# Patient Record
Sex: Male | Born: 1990 | Race: White | Hispanic: Yes | Marital: Single | Smoking: Former smoker
Health system: Southern US, Community
[De-identification: ages and names within clinical notes are randomized; demographics above are authoritative.]

## PROBLEM LIST (undated history)

## (undated) DIAGNOSIS — F329 Major depressive disorder, single episode, unspecified: Secondary | ICD-10-CM

## (undated) DIAGNOSIS — F419 Anxiety disorder, unspecified: Secondary | ICD-10-CM

## (undated) DIAGNOSIS — G4700 Insomnia, unspecified: Secondary | ICD-10-CM

## (undated) DIAGNOSIS — F32A Depression, unspecified: Secondary | ICD-10-CM

## (undated) DIAGNOSIS — E669 Obesity, unspecified: Secondary | ICD-10-CM

## (undated) HISTORY — DX: Obesity, unspecified: E66.9

## (undated) HISTORY — DX: Depression, unspecified: F32.A

## (undated) HISTORY — DX: Anxiety disorder, unspecified: F41.9

## (undated) HISTORY — DX: Major depressive disorder, single episode, unspecified: F32.9

## (undated) HISTORY — DX: Insomnia, unspecified: G47.00

---

## 2011-05-13 ENCOUNTER — Ambulatory Visit (INDEPENDENT_AMBULATORY_CARE_PROVIDER_SITE_OTHER): Payer: BC Managed Care – PPO | Admitting: Physician Assistant

## 2011-05-13 ENCOUNTER — Encounter: Payer: Self-pay | Admitting: Physician Assistant

## 2011-05-13 DIAGNOSIS — Z0000 Encounter for general adult medical examination without abnormal findings: Secondary | ICD-10-CM

## 2011-05-13 DIAGNOSIS — F32A Depression, unspecified: Secondary | ICD-10-CM | POA: Insufficient documentation

## 2011-05-13 DIAGNOSIS — G4700 Insomnia, unspecified: Secondary | ICD-10-CM | POA: Insufficient documentation

## 2011-05-13 DIAGNOSIS — E669 Obesity, unspecified: Secondary | ICD-10-CM

## 2011-05-13 DIAGNOSIS — J029 Acute pharyngitis, unspecified: Secondary | ICD-10-CM

## 2011-05-13 DIAGNOSIS — F419 Anxiety disorder, unspecified: Secondary | ICD-10-CM | POA: Insufficient documentation

## 2011-05-13 DIAGNOSIS — K5900 Constipation, unspecified: Secondary | ICD-10-CM

## 2011-05-13 DIAGNOSIS — F329 Major depressive disorder, single episode, unspecified: Secondary | ICD-10-CM | POA: Insufficient documentation

## 2011-05-13 DIAGNOSIS — Z7251 High risk heterosexual behavior: Secondary | ICD-10-CM

## 2011-05-13 DIAGNOSIS — F341 Dysthymic disorder: Secondary | ICD-10-CM

## 2011-05-13 LAB — POCT URINALYSIS DIPSTICK
Bilirubin, UA: NEGATIVE
Blood, UA: NEGATIVE
Glucose, UA: NEGATIVE
Ketones, UA: NEGATIVE
Spec Grav, UA: >=1.03

## 2011-05-13 LAB — CBC
MCH: 26.2 pg (ref 26.0–34.0)
MCV: 79.8 fL (ref 78.0–100.0)
Platelets: 355 10*3/uL (ref 150–400)
RBC: 5.54 MIL/uL (ref 4.22–5.81)
RDW: 13.3 % (ref 11.5–15.5)
WBC: 7.6 10*3/uL (ref 4.0–10.5)

## 2011-05-13 LAB — COMPREHENSIVE METABOLIC PANEL
ALT: 30 U/L (ref 0–53)
AST: 23 U/L (ref 0–37)
CO2: 27 mEq/L (ref 19–32)
Chloride: 104 mEq/L (ref 96–112)
Creat: 0.83 mg/dL (ref 0.50–1.35)
Sodium: 140 mEq/L (ref 135–145)
Total Bilirubin: 0.5 mg/dL (ref 0.3–1.2)
Total Protein: 7.5 g/dL (ref 6.0–8.3)

## 2011-05-13 LAB — POCT UA - MICROSCOPIC ONLY
Crystals, Ur, HPF, POC: NEGATIVE
Mucus, UA: NEGATIVE
RBC, urine, microscopic: NEGATIVE

## 2011-05-13 LAB — LIPID PANEL
Cholesterol: 217 mg/dL — ABNORMAL HIGH (ref 0–200)
HDL: 46 mg/dL (ref >39)
Triglycerides: 66 mg/dL (ref <150)

## 2011-05-13 LAB — TSH: TSH: 1.558 u[IU]/mL (ref 0.350–4.500)

## 2011-05-13 MED ORDER — FLUOXETINE HCL 20 MG PO TABS: 20.0000 mg | ORAL_TABLET | Freq: Every day | ORAL | Status: DC

## 2011-05-13 MED ORDER — ALBUTEROL SULFATE HFA 108 (90 BASE) MCG/ACT IN AERS: 2.0000 | INHALATION_SPRAY | RESPIRATORY_TRACT | Status: DC | PRN

## 2011-05-13 NOTE — Progress Notes (Signed)
Patient ID: Jon Krause MRN: [id number], DOB: 1990/08/19 21 y.o. Date of Encounter: 05/13/2011, 7:29 PM  Primary Physician: No primary provider on file.  Chief Complaint: Physical (CPE)  HPI: 21 y.o. y/o male with history noted below here for CPE. He has several issues that he would like to discuss today.  1) Anxiety and depression. He has been seeing Madelaine Etienne for over 1 year now for this issue. He is doing much better currently after seeing her on a regular basis. He has not taken his Prozac for several months now. When he was taking it he states it did really help him with this anxiety and depression. He feels like his anxiety and depression have played a role in his grades declining in school. He also feel like he cannot focus while in class and studying and requests ADHD evaluation. (We will perform this at a later date.) He has not had any further thoughts or attempts of SI since prior to his last OV her in June 2012. Never with HI. He states if he feels depressed he has a great friend at school that he talks with. His SSP does play a role in his anxiety/depression. Even though he has many friends he does not feel fully accepted in college, thus he feels the need to have multiple SSP and risky sexual behaviors. He gets STD checks every three months at outside clinic, and has always tested negative. He did have one encounter with a new male partner during the winter in which he did not use a condom. Since that exposure he has had a negative STD check. He is currently waiting for his most recent STD check results that he had on 05/10/11. His father is not supportive of his lifestyle further causing stress in his life. He is slowly coming to terms with this and is beginning to feel ok about his father.    2) Insomnia. He has a difficult time turning his mind off at night. Has filled the Klonopin from the June OV, but not yet taken any. He is open to trying this medication.   3) Constipation.  Will get very constipated frequently. Does not drink much water and will goes several days without a BM. Many times when he does have a BM he is required to strain a considerable amount, and will pass painful large caliber stools. He will then notice a small amount of bright red blood on the toilet tissue. Never any blood with stool or in the toilet bowl.   4) Weight gain. He states he has recently began eating a little healthier and exercising 30 minutes several times per week. He notes when he attempts to exercise he gets winded easily and develops some wheezing. No formal diagnosis of EIA has ever been made. Even with his attempts at eating healthier and exercise he feels like he continues to gain weight. He currently drinks 2-3 regular sodas per day along with juices and sweet tea. Most food choices are fried foods. Eats one large meal per day.   5) Sore throat. Has had a mild sore throat for several days. No other URI symptoms. Afebrile. Has not tried any medications.   Eye MD: 10/2010, normal DDS: 03/2011, normal See scanned in blue sheet.  Review of Systems: Consitutional: No fever, chills, fatigue, night sweats, lymphadenopathy, or weight changes. Eyes: No visual changes, eye redness, or discharge. ENT/Mouth: See above. Ears: No otalgia, tinnitus, hearing loss, discharge. Nose: No congestion, rhinorrhea, sinus pain, or epistaxis.  Throat: No post nasal drip or teeth pain. Cardiovascular: No CP, palpitations, diaphoresis, DOE, edema, orthopnea, PND. Respiratory: No cough, hemoptysis, SOB, or wheezing. Gastrointestinal: See above. No anorexia, dysphagia, reflux, pain, nausea, vomiting, hematemesis, diarrhea, or melena. Genitourinary: No dysuria, frequency, urgency, hematuria, incontinence, nocturia, decreased urinary stream, discharge, impotence, or testicular pain/masses. Musculoskeletal: No decreased ROM, myalgias, stiffness, joint swelling, or weakness. Skin: No rash, erythema, lesion  changes, pain, warmth, jaundice, or pruritis. Neurological: No headache, dizziness, syncope, seizures, tremors, memory loss, coordination problems, or paresthesias. Psychological: No hallucinations or SI/HI. Endocrine: No fatigue, polydipsia, polyphagia, polyuria, or known diabetes. All other systems were reviewed and are otherwise negative.  Past Medical History  Diagnosis Date  . Anxiety   . Depression   . Insomnia   . Obesity      History reviewed. No pertinent past surgical history.  Home Meds:  Prior to Admission medications   Not on File    Allergies: No Known Allergies  History   Social History  . Marital Status: Single    Spouse Name: N/A    Number of Children: N/A  . Years of Education: N/A   Occupational History  . Not on file.   Social History Main Topics  . Smoking status: Current Some Day Smoker -- 0.2 packs/day for .5 years    Types: Cigarettes  . Smokeless tobacco: Never Used  . Alcohol Use: 1.8 oz/week    3 Cans of beer per week  . Drug Use: No  . Sexually Active: Yes -- Male partner(s)     MSM   Other Topics Concern  . Not on file   Social History Narrative  . No narrative on file    Family History  Problem Relation Age of Onset  . Seizures Father   . Cancer Paternal Aunt     colon    Physical Exam: Blood pressure 145/86, pulse 81, temperature 99 F (37.2 C), temperature source Oral, resp. rate 16, height 5' 9.75" (1.772 m), weight 256 lb (116.121 kg).  General: Well developed, well nourished, in no acute distress. HEENT: Normocephalic, atraumatic. Conjunctiva pink, sclera non-icteric. Pupils 2 mm constricting to 1 mm, round, regular, and equally reactive to light and accomodation. EOMI. Internal auditory canal clear. TMs with good cone of light and without pathology. Nasal mucosa pink. Nares are without discharge. No sinus tenderness. Oral mucosa pink. Dentition normal. Pharynx without exudate.   Neck: Supple. Trachea midline. No  thyromegaly. Full ROM. No lymphadenopathy. Lungs: Clear to auscultation bilaterally without wheezes, rales, or rhonchi. Breathing is of normal effort and unlabored. Cardiovascular: RRR with S1 S2. No murmurs, rubs, or gallops appreciated. Distal pulses 2+ symmetrically. No carotid or abdominal bruits. Abdomen: Soft, non-tender, non-distended with normoactive bowel sounds. No hepatosplenomegaly or masses. No rebound/guarding. No CVA tenderness. Without hernias.  Rectal: Small external hemorrhoid without thrombosis. No fissures. Rectal vault without masses. Small symmetrical prostate. Genitourinary: Circumcised male. No penile lesions. Testes descended bilaterally, and smooth without tenderness or masses.  Musculoskeletal: Full range of motion and 5/5 strength throughout. Without swelling, atrophy, tenderness, crepitus, or warmth. Extremities without clubbing, cyanosis, or edema. Calves supple. Skin: Warm and moist without erythema, ecchymosis, wounds, or rash. Neuro: A+Ox3. CN II-XII grossly intact. Moves all extremities spontaneously. Full sensation throughout. Normal gait. DTR 2+ throughout upper and lower extremities. Finger to nose intact. Psych:  Responds to questions appropriately with a normal affect.   Studies: CBC, CMET, Lipid, TSH, TC, GC/Chlamydia culture all pending. Patient is fasting. Results for  orders placed in visit on 05/13/11  POCT RAPID STREP A (OFFICE)      Component Value Range   Rapid Strep A Screen Negative  Negative   POCT URINALYSIS DIPSTICK      Component Value Range   Color, UA yellow     Clarity, UA clear     Glucose, UA neg     Bilirubin, UA neg     Ketones, UA neg     Spec Grav, UA >=1.030     Blood, UA neg     pH, UA 5.5     Protein, UA neg     Urobilinogen, UA 0.2     Nitrite, UA neg     Leukocytes, UA Negative    POCT UA - MICROSCOPIC ONLY      Component Value Range   WBC, Ur, HPF, POC 0-2     RBC, urine, microscopic neg     Bacteria, U Microscopic  trace     Mucus, UA neg     Epithelial cells, urine per micros 0-1     Crystals, Ur, HPF, POC neg     Casts, Ur, LPF, POC neg     Yeast, UA neg    IFOBT (OCCULT BLOOD)      Component Value Range   IFOBT Negative       Assessment/Plan:  21 y.o. y/o Hispanic male here for CPE with anxiety/depression, insomnia, high risk sexual behavior, constipation/BRBPR, weight gain, EIA,  pharyngitis, and possible ADHD. 1. Anxiety/Depression -Restart Prozac 20 mg #30 1 po daily RF 1 -Continue counseling -RTC/ER precautions  2. Insomnia -Likely partially related to the above -Take the Klonopin that was prescribed in June. Start with 1/2 po qhs. May titrate up to 1 qhs. -Good sleep hygiene  3. High risk sexual behavior -Advised safe sex practices -Await cultures -Patient to forward lab results of most recent STD screen once he gets them  4. Constipation/BRBPR -BRBPR likely due to constipation/hemorrhoids/straining -Miralax daily -Increase water -If hemorrhoids/bleeding RTC  5. Weight gain -Daily exercise -Healthy diet, decrease sodas and fried foods -Await TSH  6. EIA -Trial of Proventil #1 2 puffs inhaled q 4-6 hours prn RF 2 -Suspect there is a component of deconditioning in play as well  7. Pharyngitis -Negative RST -Await TC, GC culture, and Chlamydia culture -If persists RTC  8. Possible ADHD -Will address this at follow up visit as time allows -Would like to see how restarting his Prozac and Klonopin trial go, prior to initiating another medication at this time  9. Recheck pending labs. If labs are normal, patient advised to recheck in 1 month  Signed, Eula Listen, PA-C 05/13/2011 7:29 PM

## 2011-05-15 NOTE — Progress Notes (Signed)
Quick Note:  Await final results.  Eula Listen, PA-C 05/15/2011 7:34 AM ______

## 2011-05-16 LAB — GONOCOCCUS CULTURE: Organism ID, Bacteria: NO GROWTH

## 2011-05-16 LAB — CHLAMYDIA, SMEAR (DFA)

## 2011-06-17 ENCOUNTER — Ambulatory Visit: Payer: BC Managed Care – PPO | Admitting: Physician Assistant

## 2011-06-24 ENCOUNTER — Ambulatory Visit (INDEPENDENT_AMBULATORY_CARE_PROVIDER_SITE_OTHER): Payer: BC Managed Care – PPO | Admitting: Physician Assistant

## 2011-06-24 ENCOUNTER — Encounter: Payer: Self-pay | Admitting: Physician Assistant

## 2011-06-24 VITALS — BP 153/86 | HR 76 | Temp 98.5°F | Resp 16 | Ht 69.5 in | Wt 258.0 lb

## 2011-06-24 DIAGNOSIS — F909 Attention-deficit hyperactivity disorder, unspecified type: Secondary | ICD-10-CM

## 2011-06-24 DIAGNOSIS — F329 Major depressive disorder, single episode, unspecified: Secondary | ICD-10-CM

## 2011-06-24 DIAGNOSIS — F339 Major depressive disorder, recurrent, unspecified: Secondary | ICD-10-CM

## 2011-06-24 DIAGNOSIS — J301 Allergic rhinitis due to pollen: Secondary | ICD-10-CM

## 2011-06-24 MED ORDER — FLUTICASONE PROPIONATE 50 MCG/ACT NA SUSP: 2.0000 | Freq: Every day | NASAL | Status: DC

## 2011-06-24 MED ORDER — AMPHETAMINE-DEXTROAMPHETAMINE 5 MG PO TABS: 5.0000 mg | ORAL_TABLET | Freq: Two times a day (BID) | ORAL | Status: DC

## 2011-06-24 NOTE — Progress Notes (Signed)
Patient ID: Jon Krause MRN: [id number], DOB: 10-25-1990, 21 y.o. Date of Encounter: 06/24/2011, 4:21 PM  Primary Physician: No primary provider on file.  Chief Complaint: Follow up depression and to discuss ADHD  HPI: 21 y.o. year old male with history below presents for follow up of depression and to discuss difficulties with concentrating and poor grades. 1. Depression: doing quite well on Prozac 20 mg daily. He feels good about where he is life life right now except for his grades. He is enjoying spending time with his friends who are his family here in [place] as his parents are in [place]. He does speak with his family in [place] regularly and his mother continues to be supportive of him. No SI or HI. Continues to enjoy his hobbies. He has not yet taken the Klonopin to help with his insomnia. He feels like his insomnia is stemming from lack of focus during the day causing him to do his homework late at night. See below for details.  2. Difficulty focusing: He is having considerable difficulty focusing in class and at home. He is unable to sit down at a desk and complete his homework without interrupting himself many times to clean or do other household chores. He will even stop doing homework with friends to clean if he gets distracted. His grades have suffered considerably due to this. He is now on academic probation. He feels that almost all of his depression now comes from his poor academic performance. He regularly sees school counselors or his therapist to discuss his performance. He would like help focusing on his homework to be able to get it done in a timely manor that would allow for him to sleep at a good hour and perhaps improve his insomnia. Please see scanned in self ADHD evaluation.   3. Lastly, he mentions a flare up of his allergies. 1-2 weeks prior he began with increased sneezing, watery eyes, and rhinorrhea. He has suffered from allergies since moving to [place] from [place]. No wheezing or SOB. He  has not tried any OTC medications to help. He requests help.   Past Medical History  Diagnosis Date  . Anxiety   . Depression   . Insomnia   . Obesity      Home Meds: Prior to Admission medications   Medication Sig Start Date End Date Taking? Authorizing Provider  albuterol (PROVENTIL HFA;VENTOLIN HFA) 108 (90 BASE) MCG/ACT inhaler Inhale 2 puffs into the lungs every 4 (four) hours as needed for wheezing. 05/13/11 05/12/12 Yes Connee Ikner M Achsah Mcquade, PA-C  FLUoxetine (PROZAC) 20 MG tablet Take 1 tablet (20 mg total) by mouth daily. 05/13/11 08/11/11 Yes Jeidy Hoerner M Len Azeez, PA-C                  Allergies: No Known Allergies  History   Social History  . Marital Status: Single    Spouse Name: N/A    Number of Children: N/A  . Years of Education: N/A   Occupational History  . Not on file.   Social History Main Topics  . Smoking status: Current Some Day Smoker -- 0.2 packs/day for .5 years    Types: Cigarettes  . Smokeless tobacco: Never Used  . Alcohol Use: 1.8 oz/week    3 Cans of beer per week  . Drug Use: No  . Sexually Active: Yes -- Male partner(s)     MSM   Other Topics Concern  . Not on file   Social History Narrative  .  No narrative on file     Review of Systems: Constitutional: negative for chills, fever, night sweats, weight changes, or fatigue  HEENT: negative for vision changes, hearing loss, congestion, ST, epistaxis, or sinus pressure Cardiovascular: negative for chest pain or palpitations Respiratory: negative for hemoptysis, wheezing, shortness of breath, or cough Abdominal: negative for abdominal pain, nausea, vomiting, diarrhea, or constipation Dermatological: negative for rash Neurologic: negative for headache, dizziness, or syncope All other systems reviewed and are otherwise negative with the exception to those above and in the HPI.   Physical Exam: Blood pressure 153/86, pulse 76, temperature 98.5 F (36.9 C), temperature source Oral, resp. rate 16, height 5'  9.5" (1.765 m), weight 258 lb (117.028 kg)., Body mass index is 37.55 kg/(m^2). General: Well developed, well nourished, in no acute distress. Head: Normocephalic, atraumatic, eyes without discharge, sclera non-icteric, nares are without discharge. Bilateral auditory canals clear, TM's are without perforation, pearly grey and translucent with reflective cone of light bilaterally. Oral cavity moist, posterior pharynx without exudate, erythema, peritonsillar abscess, or post nasal drip.  Neck: Supple. No thyromegaly. Full ROM. No lymphadenopathy. Lungs: Clear bilaterally to auscultation without wheezes, rales, or rhonchi. Breathing is unlabored. Heart: RRR with S1 S2. No murmurs, rubs, or gallops appreciated. Msk:  Strength and tone normal for age. Extremities/Skin: Warm and dry. No clubbing or cyanosis. No edema. No rashes or suspicious lesions. Neuro: Alert and oriented X 3. Moves all extremities spontaneously. Gait is normal. CNII-XII grossly in tact. Psych:  Responds to questions appropriately with a normal affect.     ASSESSMENT AND PLAN:  21 y.o. year old male with depression, ADHD, and allergic rhinitis 1. Depression -Improving -Continue Prozac 20 mg 1 po daily, has 1 RF remaining, may call for more if runs out before next follow up in 1 month -Continue counseling -RTC/ER precautions -Good local support system  2. ADHD -Trial of Adderall 5 mg #60 1 po bid no RF SED -Recheck 1 month  3. Allergic rhinitis -OTC Zyrtec -Flonase 2 sprays each nare daily #1 RF 6 -May call for eye drops or inhaler if needed   Signed, Eula Listen, PA-C 06/24/2011 4:21 PM

## 2011-07-09 ENCOUNTER — Telehealth: Payer: Self-pay

## 2011-07-09 NOTE — Telephone Encounter (Signed)
PT STATES RYAN HAD GIVEN HIM A LOWER DOSAGE OF ADDERALL AND WAS TOLD TO CALL IT IF WASN'T WORKING AND IT ISNT' PLEASE CALL [phone]

## 2011-07-09 NOTE — Telephone Encounter (Signed)
Spoke with patient and let him know to take two tabs in am and two in pm.  Patient will call back in a week to let us know if this works.

## 2011-07-09 NOTE — Telephone Encounter (Signed)
LMOM to call back

## 2011-07-09 NOTE — Telephone Encounter (Signed)
Please advise him to go ahead and take 2 in the morning and 2 in the afternoon and call back with an update in a week or so. This will cause him to need a refill sooner, and that is ok to refill at that time.

## 2011-07-29 ENCOUNTER — Ambulatory Visit: Payer: BC Managed Care – PPO | Admitting: Physician Assistant

## 2011-07-29 ENCOUNTER — Telehealth: Payer: Self-pay

## 2011-07-29 NOTE — Telephone Encounter (Signed)
PT MISSED HIS 07/29/11 APPT (DID NOT GET A REMINDER CALL) HE HAS RESCHEDULED FOR 09/09/11. STATES RYAN HAD PRESCRIBED ADERROL 5MG  2X A DAY, THEN INCREASED TO 10MG  TWICE A DAY. 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

## 2011-07-30 MED ORDER — AMPHETAMINE-DEXTROAMPHETAMINE 10 MG PO TABS: 10.0000 mg | ORAL_TABLET | Freq: Two times a day (BID) | ORAL | Status: DC

## 2011-07-30 NOTE — Telephone Encounter (Signed)
Left message RX ready to be picked up and make sure comes in for follow up 09/09/11. Any questions call [phone]

## 2011-07-30 NOTE — Telephone Encounter (Signed)
Ok to refill Adderall. Done and printed.

## 2011-09-09 ENCOUNTER — Encounter: Payer: Self-pay | Admitting: Physician Assistant

## 2011-09-09 ENCOUNTER — Ambulatory Visit (INDEPENDENT_AMBULATORY_CARE_PROVIDER_SITE_OTHER): Payer: BC Managed Care – PPO | Admitting: Family Medicine

## 2011-09-09 ENCOUNTER — Ambulatory Visit: Payer: BC Managed Care – PPO

## 2011-09-09 VITALS — BP 122/74 | HR 69 | Temp 97.9°F | Resp 16 | Ht 69.25 in | Wt 254.0 lb

## 2011-09-09 DIAGNOSIS — F329 Major depressive disorder, single episode, unspecified: Secondary | ICD-10-CM

## 2011-09-09 DIAGNOSIS — M79643 Pain in unspecified hand: Secondary | ICD-10-CM

## 2011-09-09 DIAGNOSIS — F988 Other specified behavioral and emotional disorders with onset usually occurring in childhood and adolescence: Secondary | ICD-10-CM

## 2011-09-09 DIAGNOSIS — S60229A Contusion of unspecified hand, initial encounter: Secondary | ICD-10-CM

## 2011-09-09 DIAGNOSIS — Z23 Encounter for immunization: Secondary | ICD-10-CM

## 2011-09-09 DIAGNOSIS — M79609 Pain in unspecified limb: Secondary | ICD-10-CM

## 2011-09-09 MED ORDER — LISDEXAMFETAMINE DIMESYLATE 20 MG PO CAPS: 20.0000 mg | ORAL_CAPSULE | ORAL | Status: DC

## 2011-09-09 MED ORDER — NAPROXEN 500 MG PO TABS: 500.0000 mg | ORAL_TABLET | Freq: Two times a day (BID) | ORAL | Status: AC

## 2011-09-09 MED ORDER — FLUOXETINE HCL 40 MG PO CAPS: 40.0000 mg | ORAL_CAPSULE | Freq: Every day | ORAL | Status: DC
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
## 2011-10-10 ENCOUNTER — Telehealth: Payer: Self-pay

## 2011-10-10 NOTE — Telephone Encounter (Signed)
Jon Krause  Pt is requesting adderoll higher than 10 mg.  vyvanse did not work  Please call [phone]

## 2011-10-11 NOTE — Telephone Encounter (Signed)
Lm for him to call me back about this.

## 2011-10-11 NOTE — Telephone Encounter (Signed)
Patient notified and will RTC Sunday.

## 2011-10-11 NOTE — Telephone Encounter (Signed)
For ADD evaluations, I would prefer patient come in and discuss with Dr. Merla Riches.  I don't feel comfortable increasing the dose

## 2011-11-15 ENCOUNTER — Other Ambulatory Visit (HOSPITAL_COMMUNITY)
Admission: RE | Admit: 2011-11-15 | Discharge: 2011-11-15 | Disposition: A | Discharge status: Home / Self Care | Payer: BC Managed Care – PPO | Source: Ambulatory Visit | Attending: Family Medicine | Admitting: Family Medicine

## 2011-11-15 DIAGNOSIS — Z1151 Encounter for screening for human papillomavirus (HPV): Secondary | ICD-10-CM | POA: Insufficient documentation

## 2011-11-15 DIAGNOSIS — Z113 Encounter for screening for infections with a predominantly sexual mode of transmission: Secondary | ICD-10-CM | POA: Insufficient documentation

## 2011-11-18 ENCOUNTER — Telehealth: Payer: Self-pay

## 2011-11-18 DIAGNOSIS — F988 Other specified behavioral and emotional disorders with onset usually occurring in childhood and adolescence: Secondary | ICD-10-CM

## 2011-11-18 MED ORDER — LISDEXAMFETAMINE DIMESYLATE 20 MG PO CAPS: 20.0000 mg | ORAL_CAPSULE | ORAL | Status: DC

## 2011-11-18 NOTE — Telephone Encounter (Signed)
The most recent fill was for Vyvanse.  Rx printed for Vyvanse.

## 2011-11-18 NOTE — Telephone Encounter (Signed)
LMOM to pick up RX

## 2011-11-18 NOTE — Telephone Encounter (Signed)
PT HAD SPOKEN TO RYAN DUNN ABOUT INCREASING HIS ADDERALL RX, RYAN REFERRED HIM TO DR. Merla Riches. PT MADE AN APPOINTMENT BUT CANNOT SEE [facility] UNTIL OCT 16. PT WOULD LIKE REFILL ON CURRENT ADDERALL TO HOLD HIM OVER UNTIL THE 16TH.  BEST [phone]

## 2011-12-25 ENCOUNTER — Ambulatory Visit (INDEPENDENT_AMBULATORY_CARE_PROVIDER_SITE_OTHER): Payer: BC Managed Care – PPO | Admitting: Internal Medicine

## 2011-12-25 ENCOUNTER — Encounter: Payer: Self-pay | Admitting: Internal Medicine

## 2011-12-25 VITALS — BP 138/78 | HR 62 | Temp 98.3°F | Resp 16 | Ht 70.0 in | Wt 258.6 lb

## 2011-12-25 DIAGNOSIS — F988 Other specified behavioral and emotional disorders with onset usually occurring in childhood and adolescence: Secondary | ICD-10-CM

## 2011-12-25 DIAGNOSIS — G4700 Insomnia, unspecified: Secondary | ICD-10-CM

## 2011-12-25 DIAGNOSIS — Z6837 Body mass index (BMI) 37.0-37.9, adult: Secondary | ICD-10-CM

## 2011-12-25 DIAGNOSIS — R5383 Other fatigue: Secondary | ICD-10-CM

## 2011-12-25 DIAGNOSIS — R635 Abnormal weight gain: Secondary | ICD-10-CM

## 2011-12-25 DIAGNOSIS — F341 Dysthymic disorder: Secondary | ICD-10-CM

## 2011-12-25 DIAGNOSIS — F329 Major depressive disorder, single episode, unspecified: Secondary | ICD-10-CM

## 2011-12-25 LAB — COMPREHENSIVE METABOLIC PANEL
ALT: 40 U/L (ref 0–53)
AST: 27 U/L (ref 0–37)
Alkaline Phosphatase: 60 U/L (ref 39–117)
Calcium: 10 mg/dL (ref 8.4–10.5)
Chloride: 105 mEq/L (ref 96–112)
Creat: 0.77 mg/dL (ref 0.50–1.35)
Total Bilirubin: 0.3 mg/dL (ref 0.3–1.2)

## 2011-12-25 LAB — CBC
MCV: 76.9 fL — ABNORMAL LOW (ref 78.0–100.0)
Platelets: 314 10*3/uL (ref 150–400)
RDW: 13.8 % (ref 11.5–15.5)
WBC: 6.9 10*3/uL (ref 4.0–10.5)

## 2011-12-25 MED ORDER — FLUOXETINE HCL 20 MG PO TABS: 60.0000 mg | ORAL_TABLET | Freq: Every day | ORAL | Status: DC

## 2011-12-25 MED ORDER — HYDROCORTISONE ACETATE 25 MG RE SUPP: 25.0000 mg | Freq: Two times a day (BID) | RECTAL | Status: DC

## 2011-12-25 MED ORDER — ALBUTEROL SULFATE HFA 108 (90 BASE) MCG/ACT IN AERS: 2.0000 | INHALATION_SPRAY | Freq: Four times a day (QID) | RESPIRATORY_TRACT | Status: DC | PRN

## 2011-12-25 MED ORDER — CLONAZEPAM 1 MG PO TABS: ORAL_TABLET | ORAL | Status: DC

## 2011-12-25 NOTE — Progress Notes (Signed)
Subjective:    Patient ID: Jon Krause, male    DOB: 1990-06-28, 21 y.o.   MRN: [id number]  HPIreferred by Dr L because ADD meds not working  Has lots of problems: Wakes w/ panic sxtoms-feelings of doom,palpit,anxiety,can,t move, feels like trapped in dream-able to fall back asleep Rectal bleeding/hemorrhoids dx at [place] last month/discom with IC-anal/occas bld after/one episode white d/c/sl burning Anxiety not better in general PMH-ADD dx last yr -he was taking meds tho to stay up nights to get ahead //catch up-became much more dysfunction then w/ anx and depr///adderall =sweaty. Change to Vyvanse not successful-was more preductive at work Soc hx-from [place]-here at [employer] yr came out to parents/mom supports/Dad doesn't so he's working 2 jobs/not in school this semester/waiting til adult Ambulance person w/ BF-unpro at times Lives w/ 2 roomates-supportive   Review of Systems  Respiratory: Positive for shortness of breath and wheezing.        Has inhaler/needs refill  Gastrointestinal: Positive for constipation and blood in stool. Negative for nausea, vomiting, abdominal pain, diarrhea, abdominal distention, anal bleeding and rectal pain.  Genitourinary: Negative for dysuria, urgency, frequency, hematuria, decreased urine volume, enuresis and difficulty urinating.  Musculoskeletal:       Occipital pain pains w/ certain movements since childhood// brief  Neurological: Positive for numbness.       Paresthesias hand and feet-off and on/ assoc w/ being still       Objective:   Physical Exam Filed Vitals:   12/25/11 1415  BP: 138/78  Pulse: 62  Temp: 98.3 F (36.8 C)  Resp: 16  bmi 37 HEENT clear/No thyromegaly or lymphadenopathy Mood stable/affect appropriate        Assessment & Plan:   1. ADD (attention deficit disorder)  I believe his symptoms are most likely related to his anxiety and his terrible sleep habits and that the he will not respond to stimulants so no  further medication will be prescribed at this point for ADD   2. Insomnia  Klonopin at bedtime to ensure good sleep  3. BMI 37.0-37.9,adult    4.  Depression / Anxiety Begin Prozac to try to control both anxiety and depression  5. Fatigue  CBC, Comprehensive metabolic panel, TSH  6. Abnormal weight gain  TSH  7.  Hemorrhoids             If he does not respond anusol following his                 Diagnosis at [facility], will scope/r/o proctitis Meds ordered this encounter  Medications  . hydrocortisone (ANUSOL-HC) 25 MG suppository    Sig: Place 1 suppository (25 mg total) rectally 2 (two) times daily.    Dispense:  12 suppository    Refill:  0           2 discontinue A IC during rx  . FLUoxetine (PROZAC) 20 MG tablet    Sig: Take 3 tablets (60 mg total) by mouth daily.    Dispense:  90 tablet    Refill:  1  . clonazePAM (KLONOPIN) 1 MG tablet    Sig: One at bedtime    Dispense:  30 tablet    Refill:  0  . albuterol (PROVENTIL HFA;VENTOLIN HFA) 108 (90 BASE) MCG/ACT inhaler    Sig: Inhale 2 puffs into the lungs every 6 (six) hours as needed for wheezing.    Dispense:  1 Inhaler    Refill:  1   Followup one month sooner  if worse

## 2011-12-28 ENCOUNTER — Encounter: Payer: Self-pay | Admitting: Internal Medicine

## 2012-01-22 ENCOUNTER — Ambulatory Visit: Payer: BC Managed Care – PPO | Admitting: Internal Medicine

## 2012-03-18 ENCOUNTER — Ambulatory Visit: Payer: Self-pay | Admitting: Internal Medicine

## 2012-06-17 ENCOUNTER — Other Ambulatory Visit: Payer: Self-pay

## 2012-06-17 MED ORDER — FLUOXETINE HCL 20 MG PO TABS: 60.0000 mg | ORAL_TABLET | Freq: Every day | ORAL | Status: DC

## 2012-06-17 NOTE — Telephone Encounter (Signed)
Pt had an appt with Ryan for 4/14 (cpe and med refills). We cancelled clinic and have rescheduled him for 5/5 (first avail). Pt has been out of prozac since late January and he scheduled this appt to get refills (recommended by his therapist). Would we fill until 5/5 appt?  Pt [phone]

## 2012-06-22 ENCOUNTER — Encounter: Payer: BC Managed Care – PPO | Admitting: Physician Assistant

## 2012-07-13 ENCOUNTER — Encounter: Payer: BC Managed Care – PPO | Admitting: Physician Assistant

## 2012-07-13 NOTE — Progress Notes (Signed)
This encounter was created in error - please disregard.

## 2012-07-21 ENCOUNTER — Other Ambulatory Visit: Payer: Self-pay | Admitting: Physician Assistant

## 2012-08-20 ENCOUNTER — Ambulatory Visit: Payer: BC Managed Care – PPO | Admitting: Family Medicine

## 2014-05-10 ENCOUNTER — Telehealth: Payer: Self-pay

## 2014-05-10 ENCOUNTER — Encounter: Payer: Self-pay | Admitting: Internal Medicine

## 2014-05-10 ENCOUNTER — Other Ambulatory Visit (INDEPENDENT_AMBULATORY_CARE_PROVIDER_SITE_OTHER): Payer: BLUE CROSS/BLUE SHIELD

## 2014-05-10 ENCOUNTER — Ambulatory Visit (INDEPENDENT_AMBULATORY_CARE_PROVIDER_SITE_OTHER): Payer: BLUE CROSS/BLUE SHIELD | Admitting: Internal Medicine

## 2014-05-10 VITALS — BP 142/82 | HR 78 | Temp 98.2°F | Resp 18

## 2014-05-10 DIAGNOSIS — F329 Major depressive disorder, single episode, unspecified: Secondary | ICD-10-CM

## 2014-05-10 DIAGNOSIS — R200 Anesthesia of skin: Secondary | ICD-10-CM

## 2014-05-10 DIAGNOSIS — F32A Depression, unspecified: Secondary | ICD-10-CM

## 2014-05-10 DIAGNOSIS — R202 Paresthesia of skin: Secondary | ICD-10-CM

## 2014-05-10 LAB — CBC WITH DIFFERENTIAL/PLATELET
BASOS PCT: 0.6 % (ref 0.0–3.0)
Basophils Absolute: 0 10*3/uL (ref 0.0–0.1)
EOS PCT: 2.4 % (ref 0.0–5.0)
Eosinophils Absolute: 0.2 10*3/uL (ref 0.0–0.7)
HCT: 44.8 % (ref 39.0–52.0)
HEMOGLOBIN: 15.3 g/dL (ref 13.0–17.0)
LYMPHS PCT: 39.1 % (ref 12.0–46.0)
Lymphs Abs: 3 10*3/uL (ref 0.7–4.0)
MCHC: 34.2 g/dL (ref 30.0–36.0)
MCV: 78.2 fl (ref 78.0–100.0)
MONO ABS: 0.5 10*3/uL (ref 0.1–1.0)
MONOS PCT: 6 % (ref 3.0–12.0)
Neutro Abs: 4 10*3/uL (ref 1.4–7.7)
Neutrophils Relative %: 51.9 % (ref 43.0–77.0)
Platelets: 313 10*3/uL (ref 150.0–400.0)
RBC: 5.72 Mil/uL (ref 4.22–5.81)
RDW: 14.1 % (ref 11.5–15.5)
WBC: 7.7 10*3/uL (ref 4.0–10.5)

## 2014-05-10 LAB — COMPREHENSIVE METABOLIC PANEL
ALBUMIN: 4.8 g/dL (ref 3.5–5.2)
ALT: 28 U/L (ref 0–53)
AST: 19 U/L (ref 0–37)
Alkaline Phosphatase: 60 U/L (ref 39–117)
BUN: 10 mg/dL (ref 6–23)
CALCIUM: 9.8 mg/dL (ref 8.4–10.5)
CHLORIDE: 104 meq/L (ref 96–112)
CO2: 30 mEq/L (ref 19–32)
CREATININE: 0.83 mg/dL (ref 0.40–1.50)
GFR: 120.79 mL/min (ref >60.00)
Glucose, Bld: 95 mg/dL (ref 70–99)
Potassium: 4.3 mEq/L (ref 3.5–5.1)
Sodium: 141 mEq/L (ref 135–145)
Total Bilirubin: 0.6 mg/dL (ref 0.2–1.2)
Total Protein: 7.9 g/dL (ref 6.0–8.3)

## 2014-05-10 LAB — HEMOGLOBIN A1C: HEMOGLOBIN A1C: 5.6 % (ref 4.6–6.5)

## 2014-05-10 LAB — TSH: TSH: 1.12 u[IU]/mL (ref 0.35–4.50)

## 2014-05-10 LAB — VITAMIN B12: VITAMIN B 12: 349 pg/mL (ref 211–911)

## 2014-05-10 LAB — FOLATE: Folate: 10.4 ng/mL (ref >5.9)

## 2014-05-10 MED ORDER — VORTIOXETINE HBR 10 MG PO TABS: 1.0000 | ORAL_TABLET | Freq: Every day | ORAL | Status: DC

## 2014-05-10 NOTE — Progress Notes (Signed)
Pre visit review using our clinic review tool, if applicable. No additional management support is needed unless otherwise documented below in the visit note. 

## 2014-05-10 NOTE — Telephone Encounter (Signed)
Received pharmacy rejection stating that insurance will not cover Brintellix  without a prior authorization. [employer]ed insurance, spoke with [place]owneMatt at [phone] (pharmacy ID [id number]), provided clinical information and was advised may take up to 2 days for a decision.

## 2014-05-10 NOTE — Patient Instructions (Signed)

## 2014-05-11 ENCOUNTER — Encounter: Payer: Self-pay | Admitting: Internal Medicine

## 2014-05-11 NOTE — Progress Notes (Signed)
Subjective:    Patient ID: Jon Krause, male    DOB: 1990/12/04, 24 y.o.   MRN: [id number]  HPI Comments: New to me he comes in for evaluation of depression for many years and now numbness and tingling in both hands and feet for several months.     Review of Systems  Constitutional: Positive for unexpected weight change (wt gain). Negative for fever, chills, diaphoresis, appetite change and fatigue.  HENT: Negative.   Eyes: Negative.   Respiratory: Negative.  Negative for cough, choking, chest tightness, shortness of breath and stridor.   Cardiovascular: Negative.  Negative for chest pain, palpitations and leg swelling.  Gastrointestinal: Negative.  Negative for nausea, vomiting, abdominal pain, diarrhea, constipation and blood in stool.  Endocrine: Negative.   Genitourinary: Negative.   Musculoskeletal: Negative.  Negative for myalgias, back pain, joint swelling, arthralgias and neck pain.  Skin: Negative.   Allergic/Immunologic: Negative.   Neurological: Positive for numbness. Negative for dizziness, tremors, seizures, syncope, facial asymmetry, speech difficulty, weakness, light-headedness and headaches.  Hematological: Negative.  Negative for adenopathy. Does not bruise/bleed easily.  Psychiatric/Behavioral: Positive for sleep disturbance (DFA and FA), dysphoric mood (anhedonia and irritability) and decreased concentration. Negative for suicidal ideas, hallucinations, behavioral problems, confusion, self-injury and agitation. The patient is nervous/anxious. The patient is not hyperactive.        He has been treated for depression and anxiety for many years, he tried prozac and sertraline in the past but after taking these for several months he did not see much benefit, he has been suicidal in the past but not recently. He complains that the depression makes him feel foggy and damages his memory.       Objective:   Physical Exam  Constitutional: He is oriented to person, place,  and time. He appears well-developed and well-nourished. No distress.  HENT:  Head: Normocephalic and atraumatic.  Mouth/Throat: Oropharynx is clear and moist. No oropharyngeal exudate.  Eyes: Conjunctivae are normal. Right eye exhibits no discharge. Left eye exhibits no discharge. No scleral icterus.  Neck: Normal range of motion. Neck supple. No JVD present. No tracheal deviation present. No thyromegaly present.  Cardiovascular: Normal rate, regular rhythm, normal heart sounds and intact distal pulses.  Exam reveals no gallop and no friction rub.   No murmur heard. Pulmonary/Chest: Effort normal and breath sounds normal. No stridor. No respiratory distress. He has no wheezes. He has no rales. He exhibits no tenderness.  Abdominal: Soft. Bowel sounds are normal. He exhibits no distension and no mass. There is no tenderness. There is no rebound and no guarding.  Musculoskeletal: Normal range of motion. He exhibits no edema or tenderness.  Lymphadenopathy:    He has no cervical adenopathy.  Neurological: He is alert and oriented to person, place, and time. He has normal strength. He displays no atrophy, no tremor and normal reflexes. No cranial nerve deficit or sensory deficit. He exhibits normal muscle tone. He displays a negative Romberg sign. He displays no seizure activity. Coordination and gait normal. He displays no Babinski's sign on the right side. He displays no Babinski's sign on the left side.  Reflex Scores:      Tricep reflexes are 1+ on the right side and 1+ on the left side.      Bicep reflexes are 1+ on the right side and 1+ on the left side.      Brachioradialis reflexes are 1+ on the right side and 1+ on the left side.  Patellar reflexes are 1+ on the right side and 1+ on the left side.      Achilles reflexes are 1+ on the right side and 1+ on the left side. Skin: Skin is warm and dry. No rash noted. He is not diaphoretic. No erythema. No pallor.  Psychiatric: His behavior is  normal. Judgment and thought content normal. His mood appears anxious. His affect is not angry, not blunt, not labile and not inappropriate. His speech is not rapid and/or pressured, not delayed, not tangential and not slurred. He is not aggressive, not slowed and not withdrawn. Cognition and memory are normal. He exhibits a depressed mood. He expresses no homicidal and no suicidal ideation. He expresses no suicidal plans and no homicidal plans. He is communicative. He is attentive.  Vitals reviewed.    Lab Results  Component Value Date   WBC 7.7 05/10/2014   HGB 15.3 05/10/2014   HCT 44.8 05/10/2014   PLT 313.0 05/10/2014   GLUCOSE 95 05/10/2014   CHOL 217* 05/13/2011   TRIG 66 05/13/2011   HDL 46 05/13/2011   LDLCALC 158* 05/13/2011   ALT 28 05/10/2014   AST 19 05/10/2014   NA 141 05/10/2014   K 4.3 05/10/2014   CL 104 05/10/2014   CREATININE 0.83 05/10/2014   BUN 10 05/10/2014   CO2 30 05/10/2014   TSH 1.12 05/10/2014   HGBA1C 5.6 05/10/2014       Assessment & Plan:

## 2014-05-11 NOTE — Assessment & Plan Note (Signed)
His exam is WNL Will check his labs to check for metabolic causes of paresthesias Will consider getting a NCS/EMG if these symptoms persist

## 2014-05-11 NOTE — Assessment & Plan Note (Signed)
I think brintellix is a good option for him - will start at 5 mg per day for 2 weeks (samples given) then will increase the dose to 10 mg He will cont psychotherapy Will recheck in about 2 months

## 2014-05-12 ENCOUNTER — Telehealth: Payer: Self-pay | Admitting: Internal Medicine

## 2014-05-12 NOTE — Telephone Encounter (Signed)
emmi emailed °

## 2014-05-13 NOTE — Telephone Encounter (Signed)
Brintellix has been denied.   Faxed denial to pharmacy.  Pt informed.

## 2014-05-18 ENCOUNTER — Telehealth: Payer: Self-pay | Admitting: Internal Medicine

## 2014-05-18 NOTE — Telephone Encounter (Signed)
Pt request lab result that was done last week.

## 2014-05-19 NOTE — Telephone Encounter (Signed)
Patient notified and will advise should symptoms persist.

## 2014-05-20 MED ORDER — DULOXETINE HCL 60 MG PO CPEP: 60.0000 mg | ORAL_CAPSULE | Freq: Every day | ORAL | Status: DC

## 2014-05-20 NOTE — Telephone Encounter (Signed)
Patient called to ask for clarification on this brintillex denial. He wants to know if there will be something else prescribed in its place or if there is a plan of action on our part to address the denial.

## 2014-05-20 NOTE — Addendum Note (Signed)
Addended by: Etta GrandchildJONES, THOMAS L on: 05/20/2014 04:58 PM   Modules accepted: Orders, Medications

## 2014-05-20 NOTE — Telephone Encounter (Signed)
Yes, his insurance requires that he try cymbalta Will send to his pharmacy

## 2014-05-20 NOTE — Telephone Encounter (Signed)
Pt notified per MD and will pick up Rx

## 2015-01-17 ENCOUNTER — Other Ambulatory Visit (INDEPENDENT_AMBULATORY_CARE_PROVIDER_SITE_OTHER): Payer: BLUE CROSS/BLUE SHIELD

## 2015-01-17 ENCOUNTER — Encounter: Payer: Self-pay | Admitting: Internal Medicine

## 2015-01-17 ENCOUNTER — Ambulatory Visit (INDEPENDENT_AMBULATORY_CARE_PROVIDER_SITE_OTHER): Payer: BLUE CROSS/BLUE SHIELD | Admitting: Internal Medicine

## 2015-01-17 VITALS — BP 118/78 | HR 77 | Temp 98.0°F | Resp 16 | Ht 70.0 in | Wt 265.0 lb

## 2015-01-17 DIAGNOSIS — R197 Diarrhea, unspecified: Secondary | ICD-10-CM | POA: Diagnosis not present

## 2015-01-17 DIAGNOSIS — B356 Tinea cruris: Secondary | ICD-10-CM | POA: Diagnosis not present

## 2015-01-17 DIAGNOSIS — R202 Paresthesia of skin: Secondary | ICD-10-CM

## 2015-01-17 DIAGNOSIS — R200 Anesthesia of skin: Secondary | ICD-10-CM

## 2015-01-17 DIAGNOSIS — Z23 Encounter for immunization: Secondary | ICD-10-CM | POA: Diagnosis not present

## 2015-01-17 DIAGNOSIS — R634 Abnormal weight loss: Secondary | ICD-10-CM

## 2015-01-17 DIAGNOSIS — B353 Tinea pedis: Secondary | ICD-10-CM | POA: Diagnosis not present

## 2015-01-17 DIAGNOSIS — M2040 Other hammer toe(s) (acquired), unspecified foot: Secondary | ICD-10-CM

## 2015-01-17 LAB — COMPREHENSIVE METABOLIC PANEL
ALBUMIN: 4.6 g/dL (ref 3.5–5.2)
ALT: 23 U/L (ref 0–53)
AST: 17 U/L (ref 0–37)
Alkaline Phosphatase: 56 U/L (ref 39–117)
BUN: 11 mg/dL (ref 6–23)
CALCIUM: 9.6 mg/dL (ref 8.4–10.5)
CHLORIDE: 105 meq/L (ref 96–112)
CO2: 27 mEq/L (ref 19–32)
Creatinine, Ser: 0.71 mg/dL (ref 0.40–1.50)
GFR: 143.82 mL/min (ref >60.00)
Glucose, Bld: 92 mg/dL (ref 70–99)
POTASSIUM: 4.1 meq/L (ref 3.5–5.1)
SODIUM: 139 meq/L (ref 135–145)
Total Bilirubin: 0.6 mg/dL (ref 0.2–1.2)
Total Protein: 7.5 g/dL (ref 6.0–8.3)

## 2015-01-17 LAB — T4: T4, Total: 6.7 ug/dL (ref 4.5–12.0)

## 2015-01-17 LAB — HEMOGLOBIN A1C: Hgb A1c MFr Bld: 5.4 % (ref 4.6–6.5)

## 2015-01-17 LAB — CBC WITH DIFFERENTIAL/PLATELET
BASOS PCT: 0.5 % (ref 0.0–3.0)
Basophils Absolute: 0 10*3/uL (ref 0.0–0.1)
EOS PCT: 2.8 % (ref 0.0–5.0)
Eosinophils Absolute: 0.2 10*3/uL (ref 0.0–0.7)
HEMATOCRIT: 43.3 % (ref 39.0–52.0)
HEMOGLOBIN: 14.3 g/dL (ref 13.0–17.0)
LYMPHS PCT: 37.8 % (ref 12.0–46.0)
Lymphs Abs: 2.9 10*3/uL (ref 0.7–4.0)
MCHC: 33 g/dL (ref 30.0–36.0)
MCV: 79.1 fl (ref 78.0–100.0)
MONOS PCT: 6.2 % (ref 3.0–12.0)
Monocytes Absolute: 0.5 10*3/uL (ref 0.1–1.0)
Neutro Abs: 4 10*3/uL (ref 1.4–7.7)
Neutrophils Relative %: 52.7 % (ref 43.0–77.0)
Platelets: 307 10*3/uL (ref 150.0–400.0)
RBC: 5.47 Mil/uL (ref 4.22–5.81)
RDW: 14.2 % (ref 11.5–15.5)
WBC: 7.6 10*3/uL (ref 4.0–10.5)

## 2015-01-17 LAB — VITAMIN B12: VITAMIN B 12: 366 pg/mL (ref 211–911)

## 2015-01-17 LAB — T3, FREE: T3 FREE: 3.6 pg/mL (ref 2.3–4.2)

## 2015-01-17 LAB — FOLATE: FOLATE: 10.4 ng/mL (ref >5.9)

## 2015-01-17 LAB — TSH: TSH: 1.38 u[IU]/mL (ref 0.35–4.50)

## 2015-01-17 MED ORDER — CICLOPIROX OLAMINE 0.77 % EX CREA: TOPICAL_CREAM | Freq: Two times a day (BID) | CUTANEOUS | Status: DC

## 2015-01-17 NOTE — Patient Instructions (Signed)
Jock Itch  Jock itch (tinea cruris) is a fungal infection of the skin in the groin area. It is sometimes called ringworm, even though it is not caused by worms. It is caused by a fungus, which is a type of germ that thrives in dark, damp places. Jock itch causes a rash and itching in the groin and upper thigh area. It usually goes away in 2-3 weeks with treatment.  CAUSES  The fungus that causes jock itch may be spread by:  · Touching a fungus infection elsewhere on your body--such as athlete's foot--and then touching your groin area.  · Sharing towels or clothing with an infected person.  RISK FACTORS  Jock itch is most common in men and adolescent boys. This condition is more likely to develop from:  · Being in hot, humid climates.  · Wearing tight-fitting clothing or wet bathing suits for long periods of time.  · Participating in sports.  · Being overweight.  · Having diabetes.  SYMPTOMS  Symptoms of jock itch may include:  · A red, pink, or brown rash in the groin area. The rash may spread to the thighs, anus, and buttocks.  · Dry and scaly skin on or around the rash.  · Itchiness.  DIAGNOSIS  Most often, a health care provider can make the diagnosis by looking at your rash. Sometimes, a scraping of the infected skin will be taken. This sample may be tested by looking at it under a microscope or by trying to grow the fungus from the sample (culture).   TREATMENT  Treatment for this condition may include:  · Antifungal medicine to kill the fungus. This may be in various forms:    Skin cream or ointment.    Medicine taken by mouth.  · Skin cream or ointment to reduce the itching.  · Compresses or medicated powders to dry the infected skin.  HOME CARE INSTRUCTIONS  · Take medicines only as directed by your health care provider. Apply skin creams or ointments exactly as directed.  · Wear loose-fitting clothing.    Men should wear cotton boxer shorts.    Women should wear cotton underwear.  · Change your underwear  every day to keep your groin dry.  · Avoid hot baths.  · Dry your groin area well after bathing.    Use a separate towel to dry your groin area. This will help to prevent a spreading of the infection to other areas of your body.  · Do not scratch the affected area.  · Do not share towels with other people.  SEEK MEDICAL CARE IF:  · Your rash does not improve or it gets worse after 2 weeks of treatment.  · Your rash is spreading.  · Your rash returns after treatment is finished.  · You have a fever.  · You have redness, swelling, or pain in the area around your rash.  · You have fluid, blood, or pus coming from your rash.  · Your have your rash for more than 4 weeks.     This information is not intended to replace advice given to you by your health care provider. Make sure you discuss any questions you have with your health care provider.     Document Released: 02/15/2002 Document Revised: 03/18/2014 Document Reviewed: 12/07/2013  Elsevier Interactive Patient Education ©2016 Elsevier Inc.

## 2015-01-17 NOTE — Progress Notes (Signed)
Subjective:  Patient ID: Jon Krause, male    DOB: 1991/02/22  Age: 24 y.o. MRN: [id number]  CC: Rash   HPI Jon Krause presents for a pruritic rash in his groin and both feet. He also complains of persistent tingling in all extremities and pain in both feet, R>>L. He has had about a 20 lb unexplained weight loss and intermittent loose stools.   Outpatient Prescriptions Prior to Visit  Medication Sig Dispense Refill  . DULoxetine (CYMBALTA) 60 MG capsule Take 1 capsule (60 mg total) by mouth daily. (Patient not taking: Reported on 01/17/2015) 30 capsule 11   No facility-administered medications prior to visit.    ROS Review of Systems  Constitutional: Positive for fatigue and unexpected weight change. Negative for fever, chills, diaphoresis and appetite change.  HENT: Negative.  Negative for trouble swallowing and voice change.   Eyes: Negative.  Negative for photophobia and visual disturbance.  Respiratory: Negative.  Negative for cough, choking, chest tightness, shortness of breath and stridor.   Cardiovascular: Negative.  Negative for chest pain, palpitations and leg swelling.  Gastrointestinal: Positive for diarrhea. Negative for nausea, abdominal pain, constipation, blood in stool and rectal pain.  Endocrine: Negative.   Genitourinary: Negative.   Musculoskeletal: Negative.   Skin: Positive for color change and rash.  Allergic/Immunologic: Negative.   Neurological: Positive for numbness. Negative for dizziness, tremors, seizures, syncope, weakness, light-headedness and headaches.  Hematological: Negative.  Negative for adenopathy. Does not bruise/bleed easily.  Psychiatric/Behavioral: Negative.     Objective:  BP 118/78 mmHg  Pulse 77  Temp(Src) 98 F (36.7 C) (Oral)  Resp 16  Ht  (1.778 m)  Wt 265 lb (120.203 kg)  BMI 38.02 kg/m2  SpO2 97%  BP Readings from Last 3 Encounters:  01/17/15 118/78  05/10/14 142/82  12/25/11 138/78    Wt Readings from  Last 3 Encounters:  01/17/15 265 lb (120.203 kg)  12/25/11 258 lb 9.6 oz (117.3 kg)  09/09/11 254 lb (115.214 kg)    Physical Exam  Constitutional: He is oriented to person, place, and time. He appears well-developed and well-nourished. No distress.  HENT:  Head: Normocephalic and atraumatic.  Mouth/Throat: Oropharynx is clear and moist. No oropharyngeal exudate.  Eyes: Conjunctivae are normal. Right eye exhibits no discharge. Left eye exhibits no discharge. No scleral icterus.  Neck: Normal range of motion. Neck supple. No JVD present. No tracheal deviation present. No thyromegaly present.  Cardiovascular: Normal rate, regular rhythm, normal heart sounds and intact distal pulses.  Exam reveals no gallop and no friction rub.   No murmur heard. Pulmonary/Chest: Effort normal and breath sounds normal. No stridor. No respiratory distress. He has no wheezes. He has no rales. He exhibits no tenderness.  Abdominal: Soft. Bowel sounds are normal. He exhibits no distension and no mass. There is no tenderness. There is no rebound and no guarding.  Musculoskeletal: Normal range of motion. He exhibits no edema or tenderness.  There are hammer toes over both feet, worse on the right than the left  Lymphadenopathy:    He has no cervical adenopathy.  Neurological: He is alert and oriented to person, place, and time. He has normal strength. He displays no atrophy, no tremor and normal reflexes. No cranial nerve deficit or sensory deficit. He exhibits normal muscle tone. He displays a negative Romberg sign. He displays no seizure activity. Coordination and gait normal. He displays no Babinski's sign on the right side. He displays no Babinski's sign on the left  side.  Reflex Scores:      Tricep reflexes are 2+ on the right side and 2+ on the left side.      Bicep reflexes are 2+ on the right side and 2+ on the left side.      Brachioradialis reflexes are 2+ on the right side and 2+ on the left side.       Patellar reflexes are 1+ on the right side and 1+ on the left side.      Achilles reflexes are 1+ on the right side and 1+ on the left side. Skin: Skin is warm, dry and intact. Rash noted. No abrasion, no bruising, no burn, no ecchymosis, no laceration, no lesion, no petechiae and no purpura noted. Rash is papular. Rash is not macular, not maculopapular, not nodular, not pustular, not vesicular and not urticarial. He is not diaphoretic. No erythema. No pallor.  Over both feet there is scale and papules.  In the groin, bilaterally there is diffuse hyperpigmentation that extends over the thighs and over the scrotum.  Psychiatric: He has a normal mood and affect. His behavior is normal. Judgment and thought content normal.  Vitals reviewed.   Lab Results  Component Value Date   WBC 7.6 01/17/2015   HGB 14.3 01/17/2015   HCT 43.3 01/17/2015   PLT 307.0 01/17/2015   GLUCOSE 92 01/17/2015   CHOL 217* 05/13/2011   TRIG 66 05/13/2011   HDL 46 05/13/2011   LDLCALC 158* 05/13/2011   ALT 23 01/17/2015   AST 17 01/17/2015   NA 139 01/17/2015   K 4.1 01/17/2015   CL 105 01/17/2015   CREATININE 0.71 01/17/2015   BUN 11 01/17/2015   CO2 27 01/17/2015   TSH 1.38 01/17/2015   HGBA1C 5.4 01/17/2015    No results found.  Assessment & Plan:   Jon Krause was seen today for rash.  Diagnoses and all orders for this visit:  Bilateral numbness and tingling of arms and legs- I will check his labs to screen for secondary and metabolic causes, I have ordered a NCS/EMG to try to identify the cause for the paresthesias -     TSH; Future -     Folate; Future -     Vitamin B12; Future -     Ambulatory referral to Neurology  Tinea inguinalis -     ciclopirox (LOPROX) 0.77 % cream; Apply topically 2 (two) times daily.  Tinea pedis of both feet -     ciclopirox (LOPROX) 0.77 % cream; Apply topically 2 (two) times daily.  Loss of weight- I will screen for thyroid disease, DM, anemia, celiac dz  -      Comprehensive metabolic panel; Future -     CBC with Differential/Platelet; Future -     Hemoglobin A1c; Future -     TSH; Future -     T3, free; Future -     T4; Future  Diarrhea, unspecified type- will check a panel to screen for celiac disease, metabolic disease, etc. -     Comprehensive metabolic panel; Future -     CBC with Differential/Platelet; Future -     Hemoglobin A1c; Future -     TSH; Future -     T3, free; Future -     T4; Future -     Gliadin antibodies, serum -     Tissue transglutaminase, IgA -     Reticulin Antibody, IgA w reflex titer  Hammer toe, acquired, unspecified laterality -  Ambulatory referral to Orthopedic Surgery  I have discontinued Mr. Morales DULoxetine. I am also having him start on ciclopirox.  Meds ordered this encounter  Medications  . ciclopirox (LOPROX) 0.77 % cream    Sig: Apply topically 2 (two) times daily.    Dispense:  90 g    Refill:  2     Follow-up: Return in about 6 weeks (around 02/28/2015).  Sanda Linger, MD

## 2015-01-17 NOTE — Progress Notes (Signed)
Pre visit review using our clinic review tool, if applicable. No additional management support is needed unless otherwise documented below in the visit note. 

## 2015-01-18 ENCOUNTER — Encounter: Payer: Self-pay | Admitting: Internal Medicine

## 2015-01-20 ENCOUNTER — Other Ambulatory Visit: Payer: Self-pay | Admitting: *Deleted

## 2015-01-20 DIAGNOSIS — R202 Paresthesia of skin: Principal | ICD-10-CM

## 2015-01-20 DIAGNOSIS — R200 Anesthesia of skin: Secondary | ICD-10-CM

## 2015-01-24 ENCOUNTER — Encounter: Payer: BLUE CROSS/BLUE SHIELD | Admitting: Neurology

## 2015-01-26 ENCOUNTER — Telehealth: Payer: Self-pay | Admitting: *Deleted

## 2015-01-26 NOTE — Telephone Encounter (Signed)
Receive call pt is requesting labs results. Inform pt md has mail out lab letter but did relay msg on letter...Raechel Chute/lmb

## 2015-01-27 ENCOUNTER — Telehealth: Payer: Self-pay | Admitting: Internal Medicine

## 2015-01-27 NOTE — Telephone Encounter (Signed)
Left vm for patient to call to verify mailing address and spelling of road name.  If patient calls back see Tammy to remail lab work.

## 2016-02-08 ENCOUNTER — Ambulatory Visit (INDEPENDENT_AMBULATORY_CARE_PROVIDER_SITE_OTHER)
Admission: RE | Admit: 2016-02-08 | Discharge: 2016-02-08 | Disposition: A | Discharge status: Home / Self Care | Payer: BLUE CROSS/BLUE SHIELD | Source: Ambulatory Visit | Attending: Family | Admitting: Family

## 2016-02-08 ENCOUNTER — Ambulatory Visit (INDEPENDENT_AMBULATORY_CARE_PROVIDER_SITE_OTHER): Payer: BLUE CROSS/BLUE SHIELD | Admitting: Family

## 2016-02-08 ENCOUNTER — Encounter: Payer: Self-pay | Admitting: Family

## 2016-02-08 VITALS — BP 140/78 | HR 83 | Temp 98.1°F | Resp 16 | Ht 70.0 in | Wt 283.0 lb

## 2016-02-08 DIAGNOSIS — R198 Other specified symptoms and signs involving the digestive system and abdomen: Secondary | ICD-10-CM | POA: Diagnosis not present

## 2016-02-08 NOTE — Patient Instructions (Signed)
Thank you for choosing [facility].  SUMMARY AND INSTRUCTIONS:  Increase the fiber in your diet such metamucil, Benefiber or Fiber gummies.  Drink plenty of water.  Colace or docusate sodium as needed for hard stools.  Miralax or Ducolux products pending your x-ray results.    Imaging / Radiology:  Please stop by radiology on the basement level of the building for your x-rays. Your results will be released to MyChart (or called to you) after review, usually within 72 hours after test completion. If any treatments or changes are necessary, you will be notified at that same time.  Follow up:  If your symptoms worsen or fail to improve, please contact our office for further instruction, or in case of emergency go directly to the emergency room at the closest medical facility.     Constipation, Adult Constipation is when a person has fewer bowel movements in a week than normal, has difficulty having a bowel movement, or has stools that are dry, hard, or larger than normal. Constipation may be caused by an underlying condition. It may become worse with age if a person takes certain medicines and does not take in enough fluids. Follow these instructions at home: Eating and drinking  Eat foods that have a lot of fiber, such as fresh fruits and vegetables, whole grains, and beans.  Limit foods that are high in fat, low in fiber, or overly processed, such as french fries, hamburgers, cookies, candies, and soda.  Drink enough fluid to keep your urine clear or pale yellow. General instructions  Exercise regularly or as told by your health care provider.  Go to the restroom when you have the urge to go. Do not hold it in.  Take over-the-counter and prescription medicines only as told by your health care provider. These include any fiber supplements.  Practice pelvic floor retraining exercises, such as deep breathing while relaxing the lower abdomen and pelvic floor relaxation  during bowel movements.  Watch your condition for any changes.  Keep all follow-up visits as told by your health care provider. This is important. Contact a health care provider if:  You have pain that gets worse.  You have a fever.  You do not have a bowel movement after 4 days.  You vomit.  You are not hungry.  You lose weight.  You are bleeding from the anus.  You have thin, pencil-like stools. Get help right away if:  You have a fever and your symptoms suddenly get worse.  You leak stool or have blood in your stool.  Your abdomen is bloated.  You have severe pain in your abdomen.  You feel dizzy or you faint. This information is not intended to replace advice given to you by your health care provider. Make sure you discuss any questions you have with your health care provider. Document Released: 11/24/2003 Document Revised: 09/15/2015 Document Reviewed: 08/16/2015 Elsevier Interactive Patient Education  2017 [employer]

## 2016-02-08 NOTE — Assessment & Plan Note (Signed)
Abdominal fullness with concern for constipation and unlikely obstruction. Obtain x-ray. Has had fairly significant weight gain which is most likely related to bloating. Encouraged increased fiber and water intake. Continue physical activity. Over-the-counter medications as needed for symptom relief and supportive care pending x-ray results.

## 2016-02-08 NOTE — Progress Notes (Signed)
Subjective:    Patient ID: Jon Harrisonntonio Makin, male    DOB: February 22, 1991, 25 y.o.   MRN: [id number]  Chief Complaint  Patient presents with  . Diarrhea    has had diarrhea for 3 days, states he feels really uncomfortable and feels weird, does has nausea at times    HPI:  Jon Krause is a 25 y.o. male who  has a past medical history of Anxiety; Depression; Insomnia; and Obesity. and presents today for an acute office visit.   This is a new problem. Associated symptoms of diarrhea and feeling uncomfortable have been going on for about 3 days. Has also has had some nausea on occasion. Frequency of bowel movements has decreased since initial onset. Does express that he has a bloated feeling. No fevers or abdominal pain. Does express some cramping which is relieved by going to the bathroom. Cramping is located around his lower abdomen. He is concerned with weight as he has been going to the gym but notes he has had a weight gain over the last month.   No Known Allergies    Outpatient Medications Prior to Visit  Medication Sig Dispense Refill  . ciclopirox (LOPROX) 0.77 % cream Apply topically 2 (two) times daily. 90 g 2   No facility-administered medications prior to visit.       No past surgical history on file.    Past Medical History:  Diagnosis Date  . Anxiety   . Depression   . Insomnia   . Obesity       Review of Systems  Constitutional: Negative for chills and fever.  Respiratory: Negative for chest tightness and shortness of breath.   Cardiovascular: Negative for chest pain, palpitations and leg swelling.  Gastrointestinal: Positive for abdominal distention, diarrhea and nausea. Negative for abdominal pain, anal bleeding, blood in stool, constipation and vomiting.      Objective:    BP 140/78 (BP Location: Left Arm, Patient Position: Sitting, Cuff Size: Large)   Pulse 83   Temp 98.1 F (36.7 C) (Oral)   Resp 16   Ht 5\' 10"  (1.778 m)   Wt 283 lb (128.4 kg)    SpO2 98%   BMI 40.61 kg/m  Nursing note and vital signs reviewed.  Physical Exam  Constitutional: He is oriented to person, place, and time. He appears well-developed and well-nourished. No distress.  Cardiovascular: Normal rate, regular rhythm, normal heart sounds and intact distal pulses.   Pulmonary/Chest: Effort normal and breath sounds normal.  Abdominal: Soft. Bowel sounds are normal. He exhibits distension. He exhibits no mass. There is no tenderness. There is no rebound and no guarding.  Neurological: He is alert and oriented to person, place, and time.  Skin: Skin is warm and dry.  Psychiatric: He has a normal mood and affect. His behavior is normal. Judgment and thought content normal.       Assessment & Plan:   Problem List Items Addressed This Visit      Other   Abdominal fullness - Primary    Abdominal fullness with concern for constipation and unlikely obstruction. Obtain x-ray. Has had fairly significant weight gain which is most likely related to bloating. Encouraged increased fiber and water intake. Continue physical activity. Over-the-counter medications as needed for symptom relief and supportive care pending x-ray results.      Relevant Orders   DG Abd 2 Views       I am having Mr. Ernestina ColumbiaRocha maintain his ciclopirox.   Follow-up: Return if  symptoms worsen or fail to improve.  Mauricio Po, FNP

## 2016-02-14 ENCOUNTER — Ambulatory Visit (INDEPENDENT_AMBULATORY_CARE_PROVIDER_SITE_OTHER): Payer: BLUE CROSS/BLUE SHIELD | Admitting: Internal Medicine

## 2016-02-14 ENCOUNTER — Encounter: Payer: Self-pay | Admitting: Internal Medicine

## 2016-02-14 ENCOUNTER — Other Ambulatory Visit (INDEPENDENT_AMBULATORY_CARE_PROVIDER_SITE_OTHER): Payer: BLUE CROSS/BLUE SHIELD

## 2016-02-14 VITALS — BP 120/70 | HR 67 | Temp 98.2°F | Resp 16 | Ht 70.0 in | Wt 281.0 lb

## 2016-02-14 DIAGNOSIS — Z7251 High risk heterosexual behavior: Secondary | ICD-10-CM | POA: Diagnosis not present

## 2016-02-14 DIAGNOSIS — F339 Major depressive disorder, recurrent, unspecified: Secondary | ICD-10-CM | POA: Diagnosis not present

## 2016-02-14 LAB — CBC WITH DIFFERENTIAL/PLATELET
BASOS ABS: 0.1 10*3/uL (ref 0.0–0.1)
Basophils Relative: 1.2 % (ref 0.0–3.0)
Eosinophils Absolute: 0.2 10*3/uL (ref 0.0–0.7)
Eosinophils Relative: 2.2 % (ref 0.0–5.0)
HCT: 43.3 % (ref 39.0–52.0)
Hemoglobin: 14.8 g/dL (ref 13.0–17.0)
LYMPHS ABS: 3.6 10*3/uL (ref 0.7–4.0)
Lymphocytes Relative: 36.3 % (ref 12.0–46.0)
MCHC: 34.1 g/dL (ref 30.0–36.0)
MCV: 78.6 fl (ref 78.0–100.0)
MONO ABS: 0.7 10*3/uL (ref 0.1–1.0)
MONOS PCT: 6.7 % (ref 3.0–12.0)
NEUTROS PCT: 53.6 % (ref 43.0–77.0)
Neutro Abs: 5.3 10*3/uL (ref 1.4–7.7)
Platelets: 320 10*3/uL (ref 150.0–400.0)
RBC: 5.52 Mil/uL (ref 4.22–5.81)
RDW: 14.3 % (ref 11.5–15.5)
WBC: 9.9 10*3/uL (ref 4.0–10.5)

## 2016-02-14 LAB — URINALYSIS, ROUTINE W REFLEX MICROSCOPIC
Bilirubin Urine: NEGATIVE
Hgb urine dipstick: NEGATIVE
Ketones, ur: NEGATIVE
Leukocytes, UA: NEGATIVE
Nitrite: NEGATIVE
RBC / HPF: NONE SEEN (ref 0–?)
SPECIFIC GRAVITY, URINE: 1.02 (ref 1.000–1.030)
TOTAL PROTEIN, URINE-UPE24: NEGATIVE
URINE GLUCOSE: NEGATIVE
Urobilinogen, UA: 0.2 (ref 0.0–1.0)
WBC, UA: NONE SEEN (ref 0–?)
pH: 6 (ref 5.0–8.0)

## 2016-02-14 LAB — COMPREHENSIVE METABOLIC PANEL
ALK PHOS: 54 U/L (ref 39–117)
ALT: 34 U/L (ref 0–53)
AST: 21 U/L (ref 0–37)
Albumin: 4.7 g/dL (ref 3.5–5.2)
BILIRUBIN TOTAL: 0.6 mg/dL (ref 0.2–1.2)
BUN: 10 mg/dL (ref 6–23)
CO2: 29 mEq/L (ref 19–32)
Calcium: 9.6 mg/dL (ref 8.4–10.5)
Chloride: 103 mEq/L (ref 96–112)
Creatinine, Ser: 0.8 mg/dL (ref 0.40–1.50)
GFR: 124.24 mL/min (ref >60.00)
GLUCOSE: 91 mg/dL (ref 70–99)
Potassium: 4 mEq/L (ref 3.5–5.1)
SODIUM: 139 meq/L (ref 135–145)
TOTAL PROTEIN: 7.8 g/dL (ref 6.0–8.3)

## 2016-02-14 MED ORDER — VILAZODONE HCL 40 MG PO TABS: 40.0000 mg | ORAL_TABLET | Freq: Every day | ORAL | 11 refills | Status: DC

## 2016-02-14 MED ORDER — VILAZODONE HCL 10 & 20 MG PO KIT: 1.0000 | PACK | Freq: Every day | ORAL | 0 refills | Status: DC

## 2016-02-14 MED ORDER — VILAZODONE HCL 40 MG PO TABS: 40.0000 mg | ORAL_TABLET | Freq: Every day | ORAL | 3 refills | Status: DC

## 2016-02-14 NOTE — Progress Notes (Signed)
Pre visit review using our clinic review tool, if applicable. No additional management support is needed unless otherwise documented below in the visit note. 

## 2016-02-14 NOTE — Patient Instructions (Signed)

## 2016-02-14 NOTE — Progress Notes (Signed)
Subjective:  Patient ID: Jon Krause, male    DOB: Jun 12, 1990  Age: 25 y.o. MRN: [id number]  CC: Depression   HPI Jon Krause presents for Concerns about depression and he wants to be screened for STIs.  He was treated for depression and anxiety about 2 years ago with sertraline but says it didn't help his symptoms very much. He complains of insomnia with frequent awakening and early morning awakening, he feels anxious, nervous, and "dark". He has felt this way for several years. He has had a few crying spells and anhedonia but he denies feeling homicidal, suicidal, worthless, helpless, or hopeless.  He considers himself to be high risk for sexually-transmitted infections. He has been going to the health department every 3 months for screening. He has never had an STI. He has had about 4 partners over the last 6 months. He has one consistent partner that he has receptive, protected anal intercourse with but they are not monogamous. His other partners are procured from dating apps. He has unprotected oral intercourse. He denies any recent episodes of sore throat, lymphadenopathy, rash, fever, chills, dysuria, hematuria, penile lesion, or penile discharge.  Outpatient Medications Prior to Visit  Medication Sig Dispense Refill  . ciclopirox (LOPROX) 0.77 % cream Apply topically 2 (two) times daily. (Patient not taking: Reported on 02/14/2016) 90 g 2   No facility-administered medications prior to visit.     ROS Review of Systems  Constitutional: Negative.  Negative for chills, diaphoresis, fatigue and fever.  HENT: Negative for trouble swallowing and voice change.   Eyes: Negative for visual disturbance.  Respiratory: Negative.  Negative for cough and shortness of breath.   Cardiovascular: Negative for chest pain, palpitations and leg swelling.  Gastrointestinal: Negative for abdominal pain, constipation, diarrhea, nausea and vomiting.  Endocrine: Negative.   Genitourinary: Negative.   Negative for difficulty urinating, discharge, dysuria, genital sores, penile swelling, scrotal swelling and urgency.  Musculoskeletal: Negative for back pain, myalgias and neck pain.  Skin: Negative.  Negative for color change and rash.  Allergic/Immunologic: Negative.   Neurological: Negative.   Hematological: Negative.  Negative for adenopathy. Does not bruise/bleed easily.  Psychiatric/Behavioral: Positive for dysphoric mood and sleep disturbance. Negative for behavioral problems, confusion, self-injury and suicidal ideas. The patient is nervous/anxious. The patient is not hyperactive.     Objective:  BP 120/70 (BP Location: Left Arm, Patient Position: Sitting, Cuff Size: Large)   Pulse 67   Temp 98.2 F (36.8 C) (Oral)   Resp 16   Ht _0  (1.778 m)   Wt 281 lb (127.5 kg)   SpO2 98%   BMI 40.32 kg/m   BP Readings from Last 3 Encounters:  02/14/16 120/70  02/08/16 140/78  01/17/15 118/78    Wt Readings from Last 3 Encounters:  02/14/16 281 lb (127.5 kg)  02/08/16 283 lb (128.4 kg)  01/17/15 265 lb (120.2 kg)    Physical Exam  Constitutional: He is oriented to person, place, and time. No distress.  HENT:  Mouth/Throat: Oropharynx is clear and moist. No oropharyngeal exudate.  Eyes: Conjunctivae are normal. Right eye exhibits no discharge. Left eye exhibits no discharge. No scleral icterus.  Neck: Normal range of motion. Neck supple. No JVD present. No tracheal deviation present. No thyromegaly present.  Cardiovascular: Normal rate, regular rhythm, normal heart sounds and intact distal pulses.  Exam reveals no gallop and no friction rub.   No murmur heard. Pulmonary/Chest: Effort normal and breath sounds normal. No stridor. No respiratory  distress. He has no wheezes. He has no rales. He exhibits no tenderness.  Abdominal: Soft. Bowel sounds are normal. He exhibits no distension and no mass. There is no tenderness. There is no rebound and no guarding.  Musculoskeletal:  Normal range of motion. He exhibits no edema, tenderness or deformity.  Lymphadenopathy:    He has no cervical adenopathy.  Neurological: He is oriented to person, place, and time.  Skin: Skin is warm and dry. No rash noted. He is not diaphoretic. No erythema. No pallor.  Vitals reviewed.   Lab Results  Component Value Date   WBC 9.9 02/14/2016   HGB 14.8 02/14/2016   HCT 43.3 02/14/2016   PLT 320.0 02/14/2016   GLUCOSE 91 02/14/2016   CHOL 217 (H) 05/13/2011   TRIG 66 05/13/2011   HDL 46 05/13/2011   LDLCALC 158 (H) 05/13/2011   ALT 34 02/14/2016   AST 21 02/14/2016   NA 139 02/14/2016   K 4.0 02/14/2016   CL 103 02/14/2016   CREATININE 0.80 02/14/2016   BUN 10 02/14/2016   CO2 29 02/14/2016   TSH 1.38 01/17/2015   HGBA1C 5.4 01/17/2015    Dg Abd 2 Views  Result Date: 02/09/2016 CLINICAL DATA:  Constipation. EXAM: ABDOMEN - 2 VIEW COMPARISON:  No recent prior . FINDINGS: Soft tissue structures are unremarkable. No bowel distention. Moderate stool noted throughout the colon. No free air. No acute bony abnormality. Degenerative changes lumbar spine and both hips. IMPRESSION: Moderate stool volume noted throughout the colon. No bowel distention. No acute abnormality . Electronically Signed   By: Marcello Moores  Register   On: 02/09/2016 06:55    Assessment & Plan:   Jon Krause was seen today for depression.  Diagnoses and all orders for this visit:  High risk sexual behavior- I have asked him to start PReP with truvada but he is not willing to commit to that yet, he was given info about PReP and truvada, all labs today are negative for infection -     CBC with Differential/Platelet; Future -     Comprehensive metabolic panel; Future -     Urinalysis, Routine w reflex microscopic; Future -     HIV antibody; Future -     RPR; Future -     Hepatitis B surface antigen; Future -     GC/Chlamydia Probe Amp; Future  Episode of recurrent major depressive disorder, unspecified depression  episode severity ([place])- will start Viibryd -     Vilazodone HCl (VIIBRYD STARTER PACK) 10 & 20 MG KIT; Take 1 tablet by mouth daily. -     Discontinue: Vilazodone HCl (VIIBRYD) 40 MG TABS; Take 1 tablet (40 mg total) by mouth daily. -     Vilazodone HCl (VIIBRYD) 40 MG TABS; Take 1 tablet (40 mg total) by mouth daily.   I have discontinued Mr. Wexler ciclopirox. I am also having him start on Vilazodone HCl. Additionally, I am having him maintain his Vilazodone HCl.  Meds ordered this encounter  Medications  . Vilazodone HCl (VIIBRYD STARTER PACK) 10 & 20 MG KIT    Sig: Take 1 tablet by mouth daily.    Dispense:  1 kit    Refill:  0  . DISCONTD: Vilazodone HCl (VIIBRYD) 40 MG TABS    Sig: Take 1 tablet (40 mg total) by mouth daily.    Dispense:  30 tablet    Refill:  11  . Vilazodone HCl (VIIBRYD) 40 MG TABS    Sig: Take 1  tablet (40 mg total) by mouth daily.    Dispense:  90 tablet    Refill:  3     Follow-up: Return in about 3 weeks (around 03/06/2016).  Scarlette Calico, MD

## 2016-02-15 ENCOUNTER — Encounter: Payer: Self-pay | Admitting: Internal Medicine

## 2016-02-15 LAB — GC/CHLAMYDIA PROBE AMP
CT PROBE, AMP APTIMA: NOT DETECTED
GC PROBE AMP APTIMA: NOT DETECTED

## 2016-02-15 LAB — RPR

## 2016-02-15 LAB — HEPATITIS B SURFACE ANTIGEN: Hepatitis B Surface Ag: NEGATIVE

## 2016-02-15 LAB — HIV ANTIBODY (ROUTINE TESTING W REFLEX): HIV 1&2 Ab, 4th Generation: NONREACTIVE

## 2016-02-28 ENCOUNTER — Ambulatory Visit: Payer: BLUE CROSS/BLUE SHIELD | Admitting: Internal Medicine

## 2016-04-18 ENCOUNTER — Encounter (HOSPITAL_COMMUNITY): Payer: Self-pay | Admitting: Emergency Medicine

## 2016-04-18 ENCOUNTER — Ambulatory Visit (HOSPITAL_COMMUNITY)
Admission: EM | Admit: 2016-04-18 | Discharge: 2016-04-18 | Disposition: A | Discharge status: Home / Self Care | Payer: Self-pay | Attending: Emergency Medicine | Admitting: Emergency Medicine

## 2016-04-18 DIAGNOSIS — J069 Acute upper respiratory infection, unspecified: Secondary | ICD-10-CM

## 2016-04-18 MED ORDER — FLUTICASONE PROPIONATE 50 MCG/ACT NA SUSP: 2.0000 | Freq: Every day | NASAL | 0 refills | Status: DC

## 2016-04-18 MED ORDER — IBUPROFEN 800 MG PO TABS: 800.0000 mg | ORAL_TABLET | Freq: Three times a day (TID) | ORAL | 0 refills | Status: DC

## 2016-04-18 NOTE — ED Triage Notes (Signed)
Pt c/o cold sx onset: last night  Sx include: ST, felt warm, nasal congestion/drainage, prod cough, BA  Denies: fevers  Taking: OTC cold meds w/temp relief.   A&O x4... NAD

## 2016-04-18 NOTE — ED Provider Notes (Signed)
HPI  SUBJECTIVE:  Jon Krause is a 26 y.o. male who presents with body aches a "tension headache", nasal congestion and rhinorrhea, postnasal drip, fatigue, weakness, and a sore throat starting yesterday. He reports facial "tightness" and purulent nasal congestion. Reports occasional shortness of breath with exertion. He reports decreased appetite, but is tolerating by mouth. He reports loose watery stools 2 times per day. He has tried NyQuil and neck in a shoulder with no relief. There are no aggravating factors. He denies ear pain, dental pain, facial swelling. No sinus pain or pressure, neck stiffness photophobia, rash. No coughing, wheezing. No abdominal pain, urinary complaints. No nausea, vomiting. No current skin infection. He did get a flu shot this year. No antipyretic in the past 6-8 hours. No known contacts with the flu. States that he is sleeping well at night. He has no past medical history including lung disease, strep, mono, diabetes, hypertension, HIV, edema compromise, cancer. He is not a smoker. QVZ:DGLOVF Ronnald Ramp, MD     Past Medical History:  Diagnosis Date  . Anxiety   . Depression   . Insomnia   . Obesity     History reviewed. No pertinent surgical history.  Family History  Problem Relation Age of Onset  . Seizures Father   . Cancer Paternal Aunt     colon  . Cancer Maternal Aunt   . Alcohol abuse Neg Hx   . Depression Neg Hx   . Diabetes Neg Hx   . Drug abuse Neg Hx   . Heart disease Neg Hx   . Hyperlipidemia Neg Hx   . Hypertension Neg Hx   . Kidney disease Neg Hx   . Stroke Neg Hx     Social History  Substance Use Topics  . Smoking status: Current Some Day Smoker    Packs/day: 0.25    Years: 0.50    Types: Cigarettes  . Smokeless tobacco: Never Used  . Alcohol use 1.8 oz/week    3 Cans of beer per week    No current facility-administered medications for this encounter.   Current Outpatient Prescriptions:  .  fluticasone (FLONASE) 50 MCG/ACT  nasal spray, Place 2 sprays into both nostrils daily., Disp: 16 g, Rfl: 0 .  ibuprofen (ADVIL,MOTRIN) 800 MG tablet, Take 1 tablet (800 mg total) by mouth 3 (three) times daily., Disp: 30 tablet, Rfl: 0 .  Vilazodone HCl (VIIBRYD STARTER PACK) 10 & 20 MG KIT, Take 1 tablet by mouth daily., Disp: 1 kit, Rfl: 0 .  Vilazodone HCl (VIIBRYD) 40 MG TABS, Take 1 tablet (40 mg total) by mouth daily., Disp: 90 tablet, Rfl: 3  No Known Allergies   ROS  As noted in HPI.   Physical Exam  BP 147/70 (BP Location: Right Arm)   Pulse 107   Temp 99.6 F (37.6 C) (Oral)   Resp 20   SpO2 99%   Constitutional: Well developed, well nourished, no acute distress Eyes: PERRL, EOMI, conjunctiva normal bilaterally HENT: Normocephalic, atraumatic,mucus membranes moist. TMs normal bilaterally. Positive erythematous, swollen turbinates. Positive clear nasal congestion. No sinus tenderness. Tonsils normal, uvula midline. Positive postnasal drip. Neck: No cervical lymphadenopathy, meningismus Respiratory: Clear to auscultation bilaterally, no rales, no wheezing, no rhonchi Cardiovascular: Normal rate and rhythm, no murmurs, no gallops, no rubs GI: Soft, nondistended, normal bowel sounds, nontender, no rebound, no guarding Back: no CVAT skin: No rash, skin intact Musculoskeletal: No edema, no tenderness, no deformities Neurologic: Alert & oriented x 3, CN II-XII grossly intact, no  motor deficits, sensation grossly intact Psychiatric: Speech and behavior appropriate   ED Course   Medications - No data to display  No orders of the defined types were placed in this encounter.  No results found for this or any previous visit (from the past 24 hour(s)). No results found.  ED Clinical Impression  Upper respiratory tract infection, unspecified type   ED Assessment/Plan  Presentation consistent with URI.  vitals are normal, afebrile, has not taken an antipyretic in the past 6-8 hours. Plan sent home with  rest, push fluids, saline nasal irrigation, Flonase, Mucinex D, ibuprofen 800 mg with 1 g of Tylenol 3 times a day. Does not appear to be flu. Also work note for 2 days. F/u with PMD as needed. Discussed MDM, plan and followup with patient. Patient agrees with plan.   Meds ordered this encounter  Medications  . fluticasone (FLONASE) 50 MCG/ACT nasal spray    Sig: Place 2 sprays into both nostrils daily.    Dispense:  16 g    Refill:  0  . ibuprofen (ADVIL,MOTRIN) 800 MG tablet    Sig: Take 1 tablet (800 mg total) by mouth 3 (three) times daily.    Dispense:  30 tablet    Refill:  0    *This clinic note was created using [employer]. Therefore, there may be occasional mistakes despite careful proofreading.  ?   Melynda Ripple, MD 04/19/16 [phone]

## 2016-04-18 NOTE — Discharge Instructions (Signed)
Take the medication as written.mucinex d maximum strength, flonase.  You may take 800 mg of motrin with 1 gram of tylenol up to 3 times a day as needed for pain. This is an effective combination for pain.  Most sinus infections are viral and do not need antibiotics unless you have a high fever, have had this for 10 days, or you get better and then get sick again. Use a neti pot or the NeilMed sinus rinse as often as you want to to reduce nasal congestion. Follow the directions on the box.   Go to www.goodrx.com to look up your medications. This will give you a list of where you can find your prescriptions at the most affordable prices.

## 2016-05-02 ENCOUNTER — Encounter (HOSPITAL_COMMUNITY): Payer: Self-pay | Admitting: Emergency Medicine

## 2016-05-02 ENCOUNTER — Ambulatory Visit (HOSPITAL_COMMUNITY)
Admission: EM | Admit: 2016-05-02 | Discharge: 2016-05-02 | Disposition: A | Discharge status: Home / Self Care | Payer: Self-pay | Attending: Family Medicine | Admitting: Family Medicine

## 2016-05-02 DIAGNOSIS — Z202 Contact with and (suspected) exposure to infections with a predominantly sexual mode of transmission: Secondary | ICD-10-CM | POA: Insufficient documentation

## 2016-05-02 DIAGNOSIS — F17210 Nicotine dependence, cigarettes, uncomplicated: Secondary | ICD-10-CM | POA: Insufficient documentation

## 2016-05-02 DIAGNOSIS — R059 Cough, unspecified: Secondary | ICD-10-CM

## 2016-05-02 DIAGNOSIS — J40 Bronchitis, not specified as acute or chronic: Secondary | ICD-10-CM | POA: Insufficient documentation

## 2016-05-02 DIAGNOSIS — R05 Cough: Secondary | ICD-10-CM

## 2016-05-02 MED ORDER — AZITHROMYCIN 250 MG PO TABS
1000.0000 mg | ORAL_TABLET | Freq: Once | ORAL | Status: AC
  Administered 2016-05-02: 1000 mg via ORAL

## 2016-05-02 MED ORDER — METHYLPREDNISOLONE 4 MG PO TBPK: ORAL_TABLET | ORAL | 0 refills | Status: DC

## 2016-05-02 MED ORDER — BENZONATATE 100 MG PO CAPS: 200.0000 mg | ORAL_CAPSULE | Freq: Three times a day (TID) | ORAL | 0 refills | Status: DC | PRN

## 2016-05-02 MED ORDER — CEFTRIAXONE SODIUM 250 MG IJ SOLR
INTRAMUSCULAR | Status: AC
  Filled 2016-05-02: qty 250

## 2016-05-02 MED ORDER — AZITHROMYCIN 250 MG PO TABS
ORAL_TABLET | ORAL | Status: AC
  Filled 2016-05-02: qty 4

## 2016-05-02 MED ORDER — CEFTRIAXONE SODIUM 250 MG IJ SOLR
250.0000 mg | Freq: Once | INTRAMUSCULAR | Status: AC
  Administered 2016-05-02: 250 mg via INTRAMUSCULAR

## 2016-05-02 MED ORDER — LIDOCAINE HCL (PF) 1 % IJ SOLN
INTRAMUSCULAR | Status: AC
  Filled 2016-05-02: qty 2

## 2016-05-02 NOTE — ED Notes (Signed)
Provided graham crackers, coke and peanut butter.  Waiting for blood draw

## 2016-05-02 NOTE — ED Provider Notes (Signed)
CSN: [id number]     Arrival date & time 05/02/16  1001 History   First MD Initiated Contact with Patient 05/02/16 1026     Chief Complaint  Patient presents with  . Cough  . Exposure to STD   (Consider location/radiation/quality/duration/timing/severity/associated sxs/prior Treatment) Patient c/o cough which is worst at night.  He denies fever.  He c/o green productive cough with blood tinge after violent coughing attacks.  States his cough is causing nausea and is very persistent.  He also c/o having these URI sx's a week ago when he had unprotected oral sex.  He wants to make sure he does not have STD.   The history is provided by the patient.  Cough  Cough characteristics:  Productive Sputum characteristics:  Green Severity:  Severe Onset quality:  Sudden Duration:  1 week Timing:  Constant Progression:  Worsening Chronicity:  New Smoker: no   Context: upper respiratory infection and weather changes   Relieved by:  Cough suppressants Worsened by:  Activity and deep breathing Ineffective treatments:  Cough suppressants and rest Associated symptoms: chest pain and wheezing   Exposure to STD  Associated symptoms include chest pain.    Past Medical History:  Diagnosis Date  . Anxiety   . Depression   . Insomnia   . Obesity    History reviewed. No pertinent surgical history. Family History  Problem Relation Age of Onset  . Seizures Father   . Cancer Paternal Aunt     colon  . Cancer Maternal Aunt   . Alcohol abuse Neg Hx   . Depression Neg Hx   . Diabetes Neg Hx   . Drug abuse Neg Hx   . Heart disease Neg Hx   . Hyperlipidemia Neg Hx   . Hypertension Neg Hx   . Kidney disease Neg Hx   . Stroke Neg Hx    Social History  Substance Use Topics  . Smoking status: Current Some Day Smoker    Packs/day: 0.25    Years: 0.50    Types: Cigarettes  . Smokeless tobacco: Never Used  . Alcohol use 1.8 oz/week    3 Cans of beer per week    Review of Systems   Constitutional: Negative.   HENT: Negative.   Eyes: Negative.   Respiratory: Positive for cough and wheezing.   Cardiovascular: Positive for chest pain.  Gastrointestinal: Negative.   Endocrine: Negative.   Genitourinary: Negative.   Musculoskeletal: Negative.   Allergic/Immunologic: Negative.   Neurological: Negative.   Hematological: Negative.   Psychiatric/Behavioral: Negative.     Allergies  Patient has no known allergies.  Home Medications   Prior to Admission medications   Medication Sig Start Date End Date Taking? Authorizing Provider  guaiFENesin (MUCINEX) 600 MG 12 hr tablet Take by mouth 2 (two) times daily.   Yes Historical Provider, MD  benzonatate (TESSALON) 100 MG capsule Take 2 capsules (200 mg total) by mouth 3 (three) times daily as needed for cough. 05/02/16   Lysbeth Penner, FNP  fluticasone (FLONASE) 50 MCG/ACT nasal spray Place 2 sprays into both nostrils daily. 04/18/16   Melynda Ripple, MD  ibuprofen (ADVIL,MOTRIN) 800 MG tablet Take 1 tablet (800 mg total) by mouth 3 (three) times daily. 04/18/16   Melynda Ripple, MD  methylPREDNISolone (MEDROL DOSEPAK) 4 MG TBPK tablet Take 6-5-4-3-2-1 po qd 05/02/16   Lysbeth Penner, FNP  Vilazodone HCl (VIIBRYD STARTER PACK) 10 & 20 MG KIT Take 1 tablet by mouth daily. 02/14/16  Janith Lima, MD  Vilazodone HCl (VIIBRYD) 40 MG TABS Take 1 tablet (40 mg total) by mouth daily. 02/14/16   Janith Lima, MD   Meds Ordered and Administered this Visit   Medications  cefTRIAXone (ROCEPHIN) injection 250 mg (250 mg Intramuscular Given 05/02/16 1109)  azithromycin (ZITHROMAX) tablet 1,000 mg (1,000 mg Oral Given 05/02/16 1108)    BP 155/95 (BP Location: Right Arm) Comment (BP Location): large cuff  Pulse 119   Temp 98.6 F (37 C) (Oral)   Resp 18   SpO2 99%  No data found.   Physical Exam  Constitutional: He appears well-developed and well-nourished.  HENT:  Head: Normocephalic and atraumatic.  Right Ear:  External ear normal.  Left Ear: External ear normal.  Mouth/Throat: Oropharynx is clear and moist.  Eyes: Conjunctivae and EOM are normal. Pupils are equal, round, and reactive to light.  Neck: Normal range of motion. Neck supple.  Cardiovascular: Normal rate, regular rhythm and normal heart sounds.   Pulmonary/Chest: Effort normal and breath sounds normal.  Abdominal: Soft. Bowel sounds are normal.  Nursing note and vitals reviewed.   Urgent Care Course     Procedures (including critical care time)  Labs Review Labs Reviewed  HIV ANTIBODY (ROUTINE TESTING)  RPR  CYTOLOGY, (ORAL, ANAL, URETHRAL) ANCILLARY ONLY    Imaging Review No results found.   Visual Acuity Review  Right Eye Distance:   Left Eye Distance:   Bilateral Distance:    Right Eye Near:   Left Eye Near:    Bilateral Near:         MDM   1. Possible exposure to STD   2. Bronchitis   3. Cough    Cytology from oral source GC/ Chlamydia Trich Hiv and rpr  Rocephin 273m IM Azithromycin 10061mpo Medrol dose pack as directed 26m75m21 Tessalon Perles 100m58mpo tid prn #21      WillLysbeth PennerP 05/02/16 1131

## 2016-05-02 NOTE — ED Triage Notes (Addendum)
Uri 3 weeks ago.  Reports cough seemed to worsen one week ago.  Patient states coughing up green phlegm and tinged blood.  Patient has sense of fatigue and symptoms worsened last night.    Patient states he is a gay, male that had unprotected sex near onset of symptoms.  Concerned if related and wants treatment regardless. Denies any std symptoms

## 2016-05-03 LAB — RPR: RPR Ser Ql: NONREACTIVE

## 2016-05-03 LAB — HIV ANTIBODY (ROUTINE TESTING W REFLEX): HIV Screen 4th Generation wRfx: NONREACTIVE

## 2016-05-03 LAB — CYTOLOGY, (ORAL, ANAL, URETHRAL) ANCILLARY ONLY
Chlamydia: NEGATIVE
Neisseria Gonorrhea: NEGATIVE
Trichomonas: NEGATIVE

## 2017-07-09 ENCOUNTER — Ambulatory Visit (HOSPITAL_COMMUNITY)
Admission: EM | Admit: 2017-07-09 | Discharge: 2017-07-09 | Disposition: A | Discharge status: Home / Self Care | Payer: Self-pay | Attending: Urgent Care | Admitting: Urgent Care

## 2017-07-09 ENCOUNTER — Encounter (HOSPITAL_COMMUNITY): Payer: Self-pay | Admitting: Emergency Medicine

## 2017-07-09 DIAGNOSIS — M79672 Pain in left foot: Secondary | ICD-10-CM

## 2017-07-09 DIAGNOSIS — R200 Anesthesia of skin: Secondary | ICD-10-CM

## 2017-07-09 DIAGNOSIS — R202 Paresthesia of skin: Secondary | ICD-10-CM

## 2017-07-09 DIAGNOSIS — M79671 Pain in right foot: Secondary | ICD-10-CM

## 2017-07-09 DIAGNOSIS — R5383 Other fatigue: Secondary | ICD-10-CM

## 2017-07-09 LAB — POCT I-STAT, CHEM 8
BUN: 12 mg/dL (ref 6–20)
CALCIUM ION: 1.21 mmol/L (ref 1.15–1.40)
CHLORIDE: 105 mmol/L (ref 101–111)
Creatinine, Ser: 0.8 mg/dL (ref 0.61–1.24)
Glucose, Bld: 91 mg/dL (ref 65–99)
HCT: 44 % (ref 39.0–52.0)
Hemoglobin: 15 g/dL (ref 13.0–17.0)
POTASSIUM: 4.1 mmol/L (ref 3.5–5.1)
Sodium: 142 mmol/L (ref 135–145)
TCO2: 26 mmol/L (ref 22–32)

## 2017-07-09 NOTE — ED Provider Notes (Signed)
MRN: [id number] DOB: [birth year]  Subjective:   Jon Krause is a 27 y.o. male presenting for 1 week history of bilateral foot pain, numbness, tingling.  He has also had intermittent tingling of his hands, mostly occurs when he wakes up and resolves on its own.  He did have an episode of dizziness yesterday as well.  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  He stands or walks for the entirety of either shift.  Takes Tylenol and/or ibuprofen for his symptoms.  He admits that he drinks some water but mostly sweet tea and Gatorade.  His primary concern today is to see if he has diabetes.  Denies fever, blurred vision, polydipsia, chest pain, nausea, vomiting, belly pain, hematuria, skin infections, polyuria.  Patient does smoke cigarettes.  He does have a family history of thyroid disease.   No Known Allergies   Past Medical History:  Diagnosis Date  . Anxiety   . Depression   . Insomnia   . Obesity      History reviewed. No pertinent surgical history.   Family History  Problem Relation Age of Onset  . Seizures Father   . Cancer Paternal Aunt        colon  . Cancer Maternal Aunt   . Alcohol abuse Neg Hx   . Depression Neg Hx   . Diabetes Neg Hx   . Drug abuse Neg Hx   . Heart disease Neg Hx   . Hyperlipidemia Neg Hx   . Hypertension Neg Hx   . Kidney disease Neg Hx   . Stroke Neg Hx      Objective:   Vitals: BP (!) 141/87   Pulse 72   Temp 98.2 F (36.8 C)   Resp 18   SpO2 100%   Physical Exam  Constitutional: He is oriented to person, place, and time. He appears well-developed and well-nourished.  HENT:  Mouth/Throat: Oropharynx is clear and moist.  Eyes: Pupils are equal, round, and reactive to light. EOM are normal. No scleral icterus.  Cardiovascular: Normal rate, regular rhythm and intact distal pulses. Exam reveals no gallop and no friction rub.  No murmur heard. Pulmonary/Chest: No respiratory distress. He has no wheezes.  He has no rales.  Neurological: He is alert and oriented to person, place, and time.  Negative Tinel and Phalen tests for his wrist bilaterally.  Skin: Skin is warm and dry.  Psychiatric: He has a normal mood and affect.    Results for orders placed or performed during the hospital encounter of 07/09/17 (from the past 24 hour(s))  I-STAT, chem 8     Status: None   Collection Time: 07/09/17 10:24 AM  Result Value Ref Range   Sodium 142 135 - 145 mmol/L   Potassium 4.1 3.5 - 5.1 mmol/L   Chloride 105 101 - 111 mmol/L   BUN 12 6 - 20 mg/dL   Creatinine, Ser 4.09 0.61 - 1.24 mg/dL   Glucose, Bld 91 65 - 99 mg/dL   Calcium, Ion 8.11 9.14 - 1.40 mmol/L   TCO2 26 22 - 32 mmol/L   Hemoglobin 15.0 13.0 - 17.0 g/dL   HCT 78.2 95.6 - 21.3 %   Assessment and Plan :   Numbness  Bilateral foot pain  Paresthesia of both hands  Other fatigue  Counseled patient on overuse, inflammatory process secondary to the nature of his work.  Discussed signs and symptoms of diabetes and thyroid disease.  Recommended patient to try  to establish care with a PCP so that he can get work-up for thyroid disease and have an A1c done to rule out diabetes more reliably than a point-of-care blood sugar test.  Follow-up as needed.  Patient left the clinic amicably.   Wallis Bamberg, PA-C 07/09/17 1047

## 2017-07-09 NOTE — Discharge Instructions (Addendum)
[facility]

## 2017-07-09 NOTE — ED Triage Notes (Signed)
Pt c/o discomfort x1 week in bilateral feet, hurts to walk "feels like they are burning" pt works 15 hour days 5 days a week, on his feet a lot. Pt also c/o numbness in bilateral legs and tingling in bilateral hands. Pt felt dizzy yesterday.

## 2018-06-20 IMAGING — DX DG ABDOMEN 2V
3 series · 3 of 3 positions shown · non-contrast
Comparison: No recent prior .

CLINICAL DATA: Constipation.

EXAM:
ABDOMEN - 2 VIEW

[abdomen erect (1 of 2)]
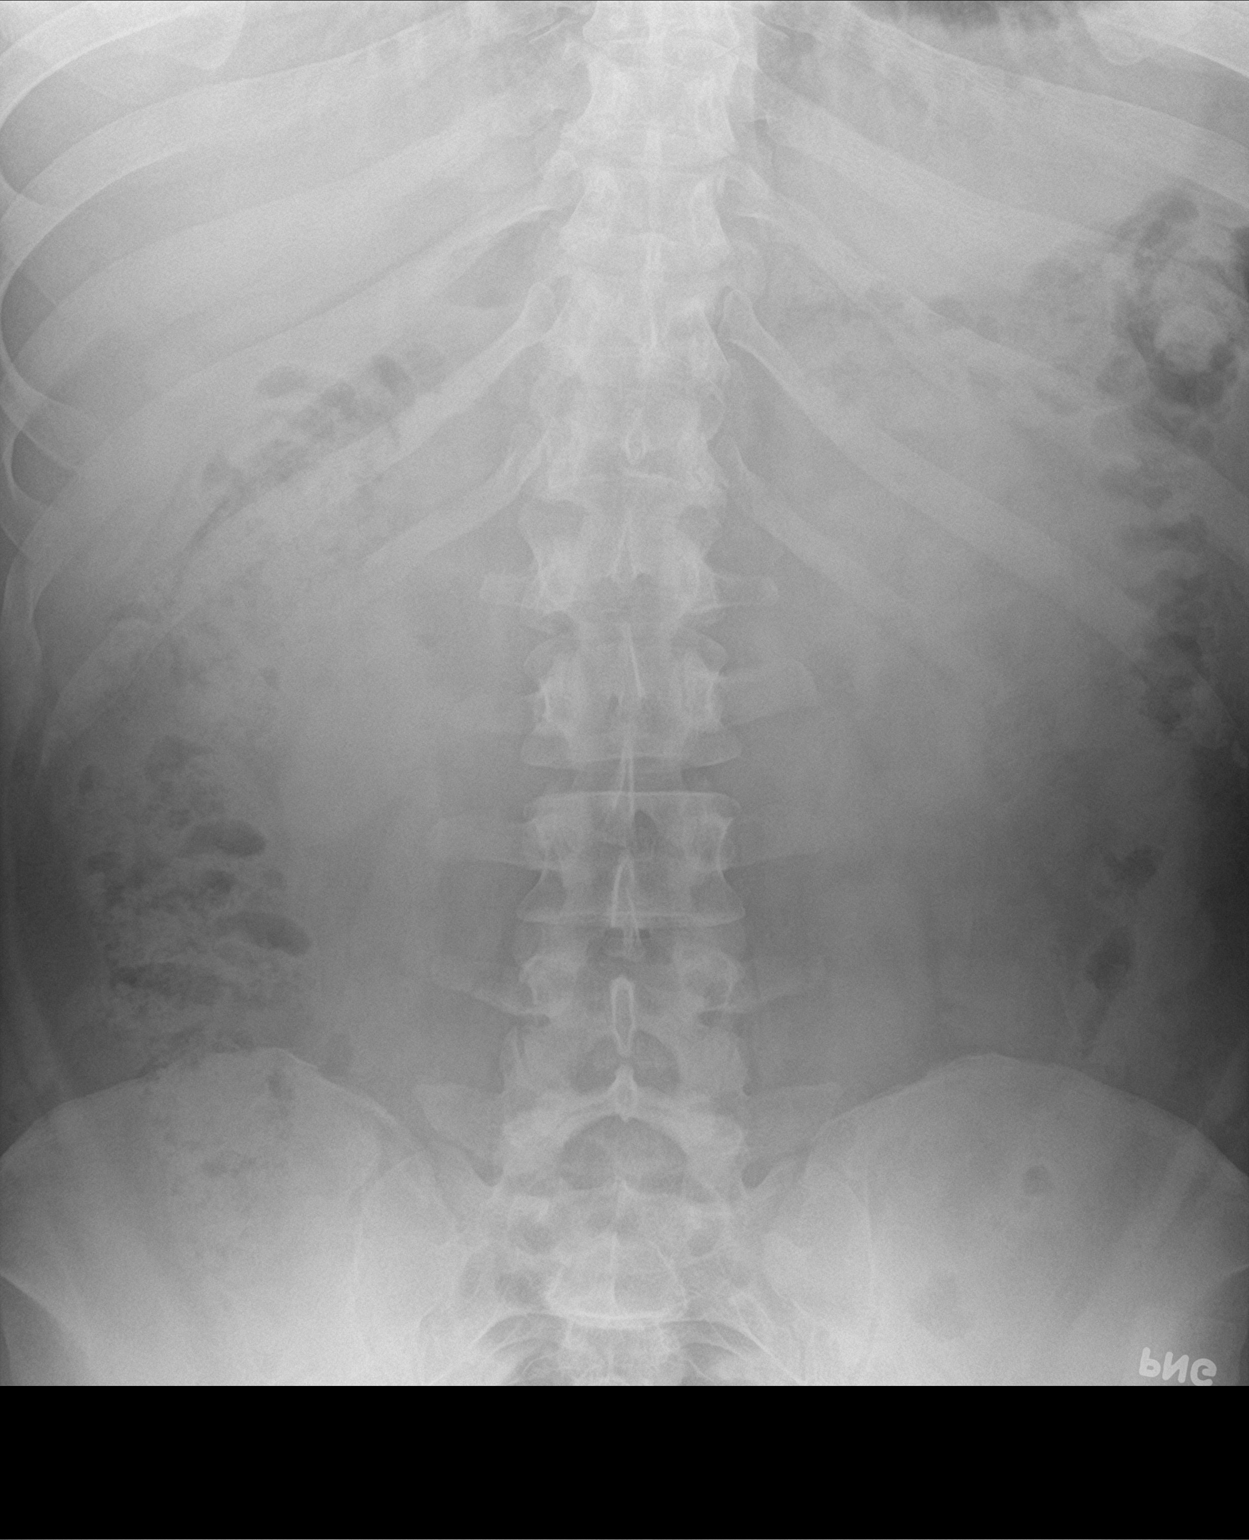

[abdomen supine]
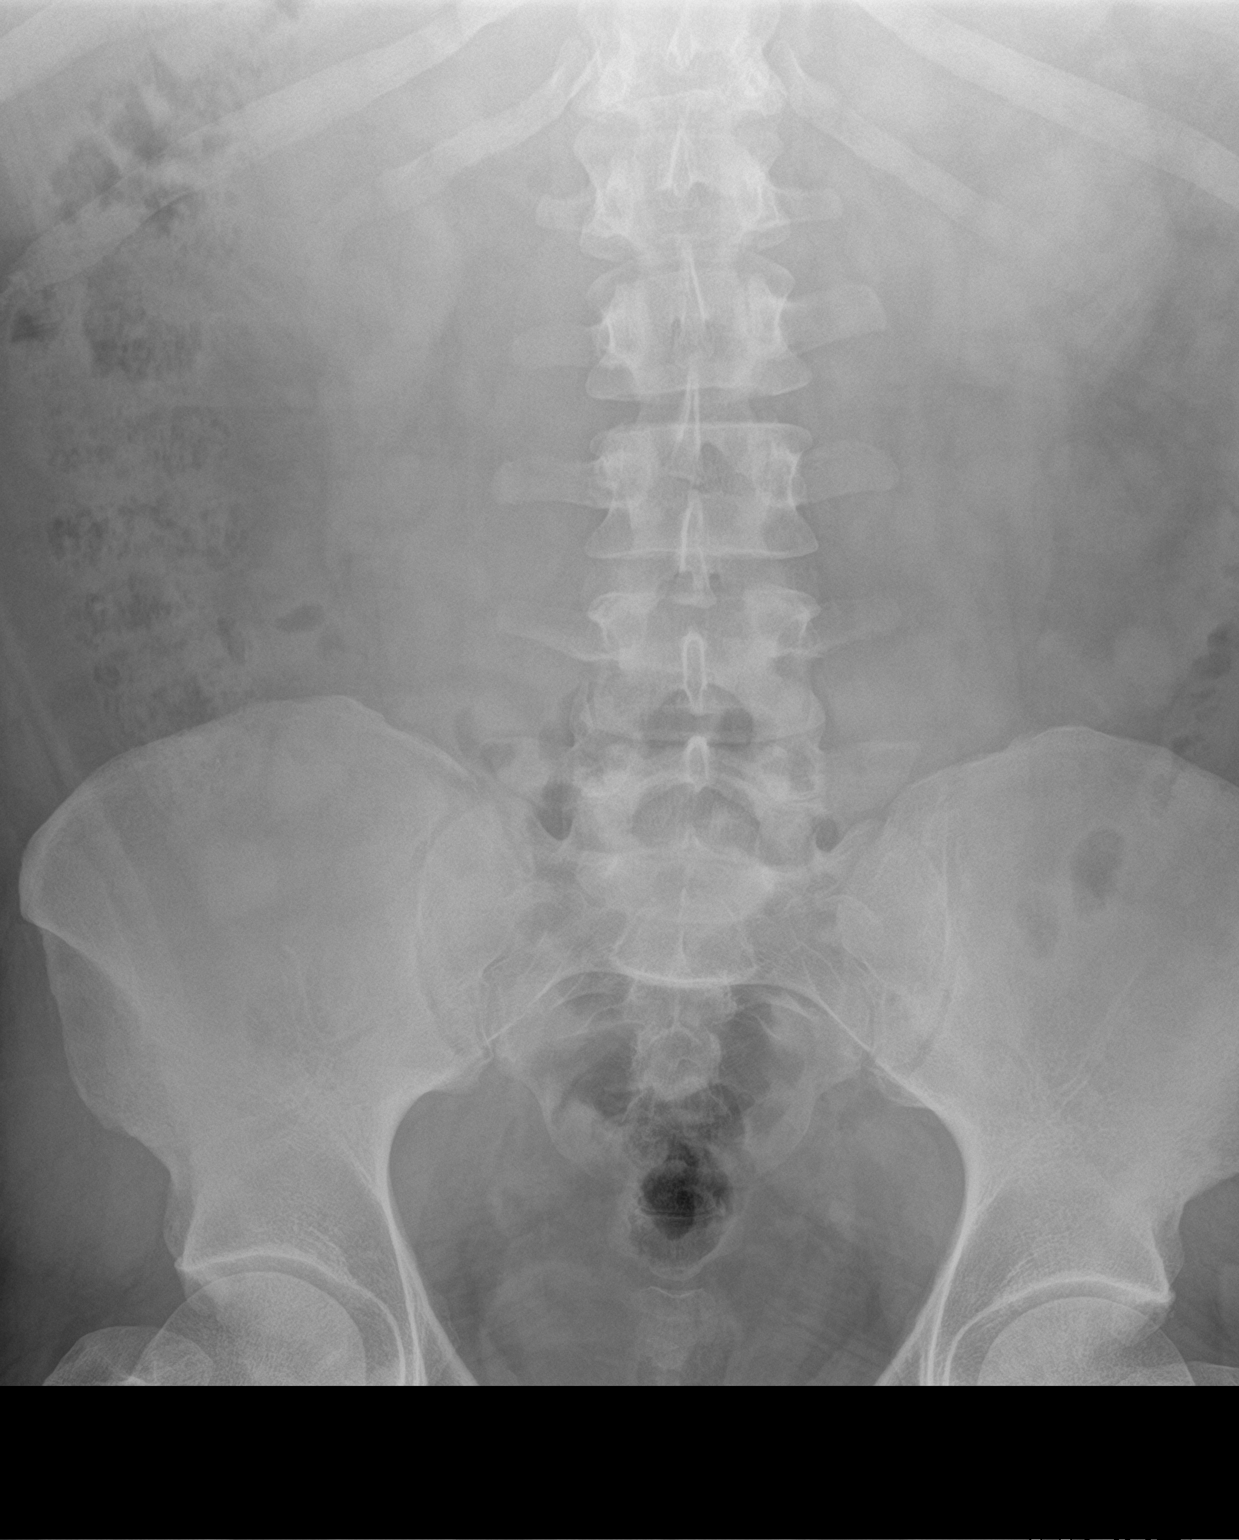

[abdomen erect (2 of 2)]
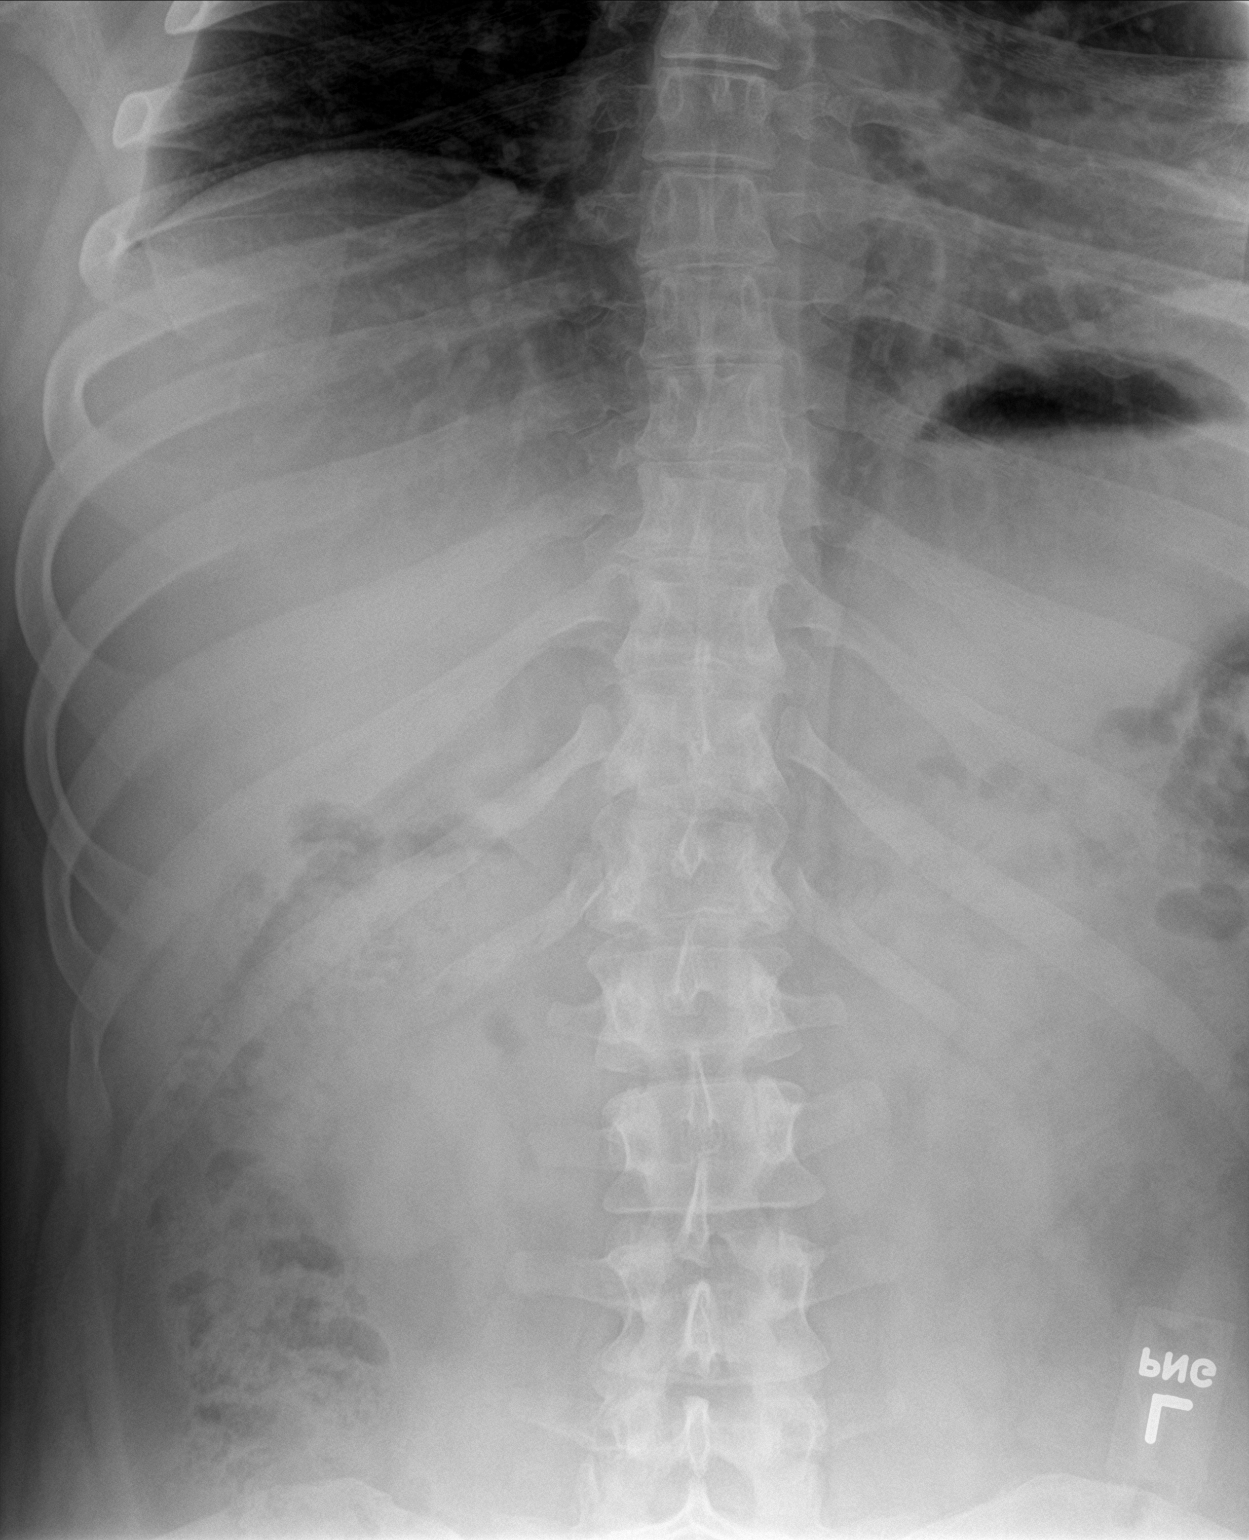

[3 of 3 positions shown; findings below may reference images not displayed]

FINDINGS: Soft tissue structures are unremarkable. No bowel distention.
Moderate stool noted throughout the colon. No free air. No acute
bony abnormality. Degenerative changes lumbar spine and both hips.
IMPRESSION: Moderate stool volume noted throughout the colon. No bowel
distention. No acute abnormality .

## 2018-08-19 ENCOUNTER — Other Ambulatory Visit: Payer: Self-pay

## 2018-08-19 ENCOUNTER — Ambulatory Visit: Payer: Self-pay

## 2018-08-19 ENCOUNTER — Telehealth: Payer: Self-pay | Admitting: Diagnostic Neuroimaging

## 2018-08-19 ENCOUNTER — Encounter (HOSPITAL_COMMUNITY): Payer: Self-pay | Admitting: Emergency Medicine

## 2018-08-19 ENCOUNTER — Emergency Department (HOSPITAL_COMMUNITY)
Admission: EM | Admit: 2018-08-19 | Discharge: 2018-08-19 | Disposition: A | Discharge status: Home / Self Care | Payer: No Typology Code available for payment source | Attending: Emergency Medicine | Admitting: Emergency Medicine

## 2018-08-19 DIAGNOSIS — R202 Paresthesia of skin: Secondary | ICD-10-CM | POA: Insufficient documentation

## 2018-08-19 DIAGNOSIS — F17210 Nicotine dependence, cigarettes, uncomplicated: Secondary | ICD-10-CM | POA: Insufficient documentation

## 2018-08-19 LAB — CBC WITH DIFFERENTIAL/PLATELET
Abs Immature Granulocytes: 0.04 10*3/uL (ref 0.00–0.07)
Basophils Absolute: 0.1 10*3/uL (ref 0.0–0.1)
Basophils Relative: 1 %
Eosinophils Absolute: 0.2 10*3/uL (ref 0.0–0.5)
Eosinophils Relative: 2 %
HCT: 45.3 % (ref 39.0–52.0)
Hemoglobin: 14.7 g/dL (ref 13.0–17.0)
Immature Granulocytes: 1 %
Lymphocytes Relative: 36 %
Lymphs Abs: 3.1 10*3/uL (ref 0.7–4.0)
MCH: 26.6 pg (ref 26.0–34.0)
MCHC: 32.5 g/dL (ref 30.0–36.0)
MCV: 82.1 fL (ref 80.0–100.0)
Monocytes Absolute: 0.5 10*3/uL (ref 0.1–1.0)
Monocytes Relative: 6 %
Neutro Abs: 4.7 10*3/uL (ref 1.7–7.7)
Neutrophils Relative %: 54 %
Platelets: 330 10*3/uL (ref 150–400)
RBC: 5.52 MIL/uL (ref 4.22–5.81)
RDW: 14 % (ref 11.5–15.5)
WBC: 8.6 10*3/uL (ref 4.0–10.5)
nRBC: 0 % (ref 0.0–0.2)

## 2018-08-19 LAB — BASIC METABOLIC PANEL
Anion gap: 10 (ref 5–15)
BUN: 10 mg/dL (ref 6–20)
CO2: 26 mmol/L (ref 22–32)
Calcium: 9.7 mg/dL (ref 8.9–10.3)
Chloride: 105 mmol/L (ref 98–111)
Creatinine, Ser: 0.81 mg/dL (ref 0.61–1.24)
GFR calc Af Amer: >60 mL/min (ref >60)
GFR calc non Af Amer: >60 mL/min (ref >60)
Glucose, Bld: 85 mg/dL (ref 70–99)
Potassium: 4 mmol/L (ref 3.5–5.1)
Sodium: 141 mmol/L (ref 135–145)
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
## 2018-08-19 NOTE — Discharge Instructions (Signed)
You were seen in the ER for numbness, tingling in your extremities, worse in your right arm.  Lab work including electrolytes and EKG were normal.  On exam he had some tingling in your arm with certain neck positioning.  The cause of your symptoms is still unclear but this may be related to a bulging disc in your neck or distal nerve inflammation or irritation.  Follow-up with neurology for further evaluation of your symptoms.  They may suggest more imaging or tests.  Return to the ER if there is sudden onset of weakness, facial drooping, loss of motor function in your extremities, severe headache, vision changes, neck pain, difficulty with speech swallowing or balance.
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
## 2018-08-19 NOTE — Telephone Encounter (Signed)
°  Due to current COVID 19 pandemic, our office is severely reducing in office visits until further notice, in order to minimize the risk to our patients and healthcare providers.    Called patient and scheduled a virtual visit with Dr. Leta Baptist for 6/17. Patient verbalized understanding of the [email or web address] process and I have sent an e-mail to [email or web address] with link and instructions as well as my name and our office number. Patient understands that they will receive a call from RN to update chart.   Pt understands that although there may be some limitations with this type of visit, we will take all precautions to reduce any security or privacy concerns.  Pt understands that this will be treated like an in office visit and we will file with pt's insurance, and there may be a patient responsible charge related to this service.

## 2018-08-19 NOTE — ED Notes (Signed)
Blood drawn on pt

## 2018-08-20 NOTE — Telephone Encounter (Signed)
Patient went to ED yesterday.

## 2018-08-25 ENCOUNTER — Encounter: Payer: Self-pay | Admitting: Diagnostic Neuroimaging

## 2018-08-25 NOTE — Telephone Encounter (Signed)
Pt returned call. Please call back when available. 

## 2018-08-25 NOTE — Telephone Encounter (Signed)
Called patient and updated EMR. 

## 2018-08-25 NOTE — Telephone Encounter (Signed)
Called patient and LVM requesting call back to update EMR.  

## 2018-08-26 ENCOUNTER — Other Ambulatory Visit: Payer: Self-pay

## 2018-08-26 ENCOUNTER — Encounter: Payer: Self-pay | Admitting: Diagnostic Neuroimaging

## 2018-08-26 ENCOUNTER — Ambulatory Visit (INDEPENDENT_AMBULATORY_CARE_PROVIDER_SITE_OTHER): Payer: No Typology Code available for payment source | Admitting: Diagnostic Neuroimaging

## 2018-08-26 DIAGNOSIS — R200 Anesthesia of skin: Secondary | ICD-10-CM | POA: Diagnosis not present

## 2018-08-26 NOTE — Progress Notes (Signed)
[facility]  PATIENT: Jon Krause DOB: 1990/05/21  REFERRING CLINICIAN: ER  HISTORY FROM: patient  REASON FOR VISIT: new consult    HISTORICAL  CHIEF COMPLAINT:  No chief complaint on file.   HISTORY OF PRESENT ILLNESS:   [age] year old male here for evaluation of numbness and tingling.  For past 2 years patient is a intermittent migratory numbness and tingling in hands, legs, right and left side.  Review of the chart indicates the patient has had the similar symptoms in 2016.  Sometimes having headaches associated nausea and numbness.  Has family history of migraine.  No specific triggering aggravating factors.  Patient does have increased anxiety lately.  Also has obesity.   REVIEW OF SYSTEMS: Full 14 system review of systems performed and negative with exception of: As per HPI.  ALLERGIES: No Known Allergies  HOME MEDICATIONS: Outpatient Medications Prior to Visit  Medication Sig Dispense Refill  . ibuprofen (ADVIL,MOTRIN) 800 MG tablet Take 1 tablet (800 mg total) by mouth 3 (three) times daily. (Patient not taking: Reported on 08/25/2018) 30 tablet 0   No facility-administered medications prior to visit.     PAST MEDICAL HISTORY: Past Medical History:  Diagnosis Date  . Anxiety   . Depression   . Insomnia   . Obesity     PAST SURGICAL HISTORY: No past surgical history on file.  FAMILY HISTORY: Family History  Problem Relation Age of Onset  . Seizures Father        grand mal  . Prostate cancer Father   . Thyroid disease Mother   . Cancer Paternal Aunt        colon  . Cancer Maternal Aunt   . Alcohol abuse Neg Hx   . Depression Neg Hx   . Diabetes Neg Hx   . Drug abuse Neg Hx   . Heart disease Neg Hx   . Hyperlipidemia Neg Hx   . Hypertension Neg Hx   . Kidney disease Neg Hx   . Stroke Neg Hx     SOCIAL HISTORY: Social History   Socioeconomic History  . Marital status: Single    Spouse name: Not on file  . Number of  children: Not on file  . Years of education: Not on file  . Highest education level: Bachelor's degree (e.g., BA, AB, BS)  Occupational History  . Not on file  Social Needs  . Financial resource strain: Not on file  . Food insecurity    Worry: Not on file    Inability: Not on file  . Transportation needs    Medical: Not on file    Non-medical: Not on file  Tobacco Use  . Smoking status: Former Smoker    Packs/day: 0.25    Years: 0.50    Pack years: 0.12    Types: Cigarettes    Quit date: 08/24/2013    Years since quitting: 5.0  . Smokeless tobacco: Never Used  Substance and Sexual Activity  . Alcohol use: Yes    Alcohol/week: 3.0 standard drinks    Types: 3 Cans of beer per week  . Drug use: No  . Sexual activity: Yes    Partners: Male    Comment: MSM  Lifestyle  . Physical activity    Days per week: Not on file    Minutes per session: Not on file  . Stress: Not on file  Relationships  . Social Musicianconnections    Talks on phone: Not on file    Gets  together: Not on file    Attends religious service: Not on file    Active member of club or organization: Not on file    Attends meetings of clubs or organizations: Not on file    Relationship status: Not on file  . Intimate partner violence    Fear of current or ex partner: Not on file    Emotionally abused: Not on file    Physically abused: Not on file    Forced sexual activity: Not on file  Other Topics Concern  . Not on file  Social History Narrative   Lives alone   Caffeine- none     PHYSICAL EXAM   VIDEO EXAM  GENERAL EXAM/CONSTITUTIONAL:  Vitals: There were no vitals filed for this visit.  There is no height or weight on file to calculate BMI. Wt Readings from Last 3 Encounters:  08/19/18 281 lb 1.4 oz (127.5 kg)  02/14/16 281 lb (127.5 kg)  02/08/16 283 lb (128.4 kg)     Patient is in no distress; well developed, nourished and groomed; neck is supple   NEUROLOGIC: MENTAL STATUS:  No flowsheet  data found.  awake, alert, oriented to person, place and time  recent and remote memory intact  normal attention and concentration  language fluent, comprehension intact, naming intact  fund of knowledge appropriate  CRANIAL NERVE:   2nd, 3rd, 4th, 6th - visual fields full to confrontation, extraocular muscles intact, no nystagmus  5th - facial sensation symmetric  7th - facial strength symmetric  8th - hearing intact  11th - shoulder shrug symmetric  12th - tongue protrusion midline  MOTOR:   NO TREMOR; NO DRIFT IN BUE  SENSORY:   normal and symmetric to light touch  COORDINATION:   fine finger movements normal    DIAGNOSTIC DATA (LABS, IMAGING, TESTING) - I reviewed patient records, labs, notes, testing and imaging myself where available.  Lab Results  Component Value Date   WBC 8.6 08/19/2018   HGB 14.7 08/19/2018   HCT 45.3 08/19/2018   MCV 82.1 08/19/2018   PLT 330 08/19/2018      Component Value Date/Time   NA 141 08/19/2018 1338   K 4.0 08/19/2018 1338   CL 105 08/19/2018 1338   CO2 26 08/19/2018 1338   GLUCOSE 85 08/19/2018 1338   BUN 10 08/19/2018 1338   CREATININE 0.81 08/19/2018 1338   CREATININE 0.77 12/25/2011 1629   CALCIUM 9.7 08/19/2018 1338   PROT 7.8 02/14/2016 1201   ALBUMIN 4.7 02/14/2016 1201   AST 21 02/14/2016 1201   ALT 34 02/14/2016 1201   ALKPHOS 54 02/14/2016 1201   BILITOT 0.6 02/14/2016 1201   GFRNONAA >60 08/19/2018 1338   GFRAA >60 08/19/2018 1338   Lab Results  Component Value Date   CHOL 217 (H) 05/13/2011   HDL 46 05/13/2011   LDLCALC 158 (H) 05/13/2011   TRIG 66 05/13/2011   CHOLHDL 4.7 05/13/2011   Lab Results  Component Value Date   HGBA1C 5.4 01/17/2015   Lab Results  Component Value Date   VITAMINB12 366 01/17/2015   Lab Results  Component Value Date   TSH 1.38 01/17/2015      ASSESSMENT AND PLAN  28 y.o. year old male here with intermittent numbness and tingling arms and legs, right  or left side, increasing headaches.  We will proceed with further work-up.  Dx:  1. Numbness     Virtual Visit via Video Note  I connected with Jon HarrisonAntonio Pheasant  on 08/26/18 at  2:30 PM EDT by a video enabled telemedicine application and verified that I am speaking with the correct person using two identifiers.  Location: Patient: home Provider: office   I discussed the limitations of evaluation and management by telemedicine and the availability of in person appointments. The patient expressed understanding and agreed to proceed.  I discussed the assessment and treatment plan with the patient. The patient was provided an opportunity to ask questions and all were answered. The patient agreed with the plan and demonstrated an understanding of the instructions.   The patient was advised to call back or seek an in-person evaluation if the symptoms worsen or if the condition fails to improve as anticipated.  I provided 30 minutes of non-face-to-face time during this encounter.    PLAN:  - MRI brain to evaluate demyelinating disease - consider EMG/NCS after MRI  Orders Placed This Encounter  Procedures  . MR BRAIN W WO CONTRAST   Return pending test results, for pending if symptoms worsen or fail to improve.    Penni Bombard, MD 1/61/0960, 4:54 PM Certified in Neurology, Neurophysiology and Neuroimaging  [facility] [address], [place], [place] [address] [phone]

## 2018-08-27 ENCOUNTER — Telehealth: Payer: Self-pay | Admitting: Diagnostic Neuroimaging

## 2018-08-27 NOTE — Telephone Encounter (Signed)
[employer] order sent to GI. They will obtain the auth and reach out to the patient to schedule.  

## 2018-09-30 ENCOUNTER — Ambulatory Visit
Admission: RE | Admit: 2018-09-30 | Discharge: 2018-09-30 | Disposition: A | Discharge status: Home / Self Care | Payer: No Typology Code available for payment source | Source: Ambulatory Visit | Attending: Diagnostic Neuroimaging | Admitting: Diagnostic Neuroimaging

## 2018-09-30 DIAGNOSIS — R200 Anesthesia of skin: Secondary | ICD-10-CM | POA: Diagnosis not present

## 2018-09-30 MED ORDER — GADOBENATE DIMEGLUMINE 529 MG/ML IV SOLN
20.0000 mL | Freq: Once | INTRAVENOUS | Status: AC | PRN
  Administered 2018-09-30: 20 mL via INTRAVENOUS

## 2018-10-01 ENCOUNTER — Telehealth: Payer: Self-pay | Admitting: *Deleted

## 2018-10-01 NOTE — Telephone Encounter (Signed)
LVM informing patient his MRI brain is normal. I advised Dr Leta Baptist stated he may consider EMG/NCS after MRI is done. I informed him Dr Leta Baptist is out of office until next Mon, left number for call back.

## 2019-03-18 ENCOUNTER — Encounter: Payer: Self-pay | Admitting: Internal Medicine

## 2019-03-18 ENCOUNTER — Other Ambulatory Visit: Payer: Self-pay

## 2019-03-18 ENCOUNTER — Ambulatory Visit (INDEPENDENT_AMBULATORY_CARE_PROVIDER_SITE_OTHER): Payer: BLUE CROSS/BLUE SHIELD | Admitting: Internal Medicine

## 2019-03-18 VITALS — BP 144/94 | HR 60 | Temp 99.3°F | Resp 16 | Ht 70.0 in | Wt 292.0 lb

## 2019-03-18 DIAGNOSIS — K219 Gastro-esophageal reflux disease without esophagitis: Secondary | ICD-10-CM | POA: Insufficient documentation

## 2019-03-18 DIAGNOSIS — Z0000 Encounter for general adult medical examination without abnormal findings: Secondary | ICD-10-CM | POA: Diagnosis not present

## 2019-03-18 DIAGNOSIS — Z23 Encounter for immunization: Secondary | ICD-10-CM | POA: Diagnosis not present

## 2019-03-18 DIAGNOSIS — Z6841 Body Mass Index (BMI) 40.0 and over, adult: Secondary | ICD-10-CM | POA: Diagnosis not present

## 2019-03-18 DIAGNOSIS — I10 Essential (primary) hypertension: Secondary | ICD-10-CM | POA: Diagnosis not present

## 2019-03-18 DIAGNOSIS — K148 Other diseases of tongue: Secondary | ICD-10-CM | POA: Diagnosis not present

## 2019-03-18 DIAGNOSIS — E559 Vitamin D deficiency, unspecified: Secondary | ICD-10-CM

## 2019-03-18 LAB — URINALYSIS, ROUTINE W REFLEX MICROSCOPIC
Bilirubin Urine: NEGATIVE
Hgb urine dipstick: NEGATIVE
Ketones, ur: NEGATIVE
Leukocytes,Ua: NEGATIVE
Nitrite: NEGATIVE
RBC / HPF: NONE SEEN (ref 0–?)
Specific Gravity, Urine: >=1.03 — AB (ref 1.000–1.030)
Total Protein, Urine: NEGATIVE
Urine Glucose: NEGATIVE
Urobilinogen, UA: 0.2 (ref 0.0–1.0)
pH: 5.5 (ref 5.0–8.0)

## 2019-03-18 LAB — LIPID PANEL
Cholesterol: 235 mg/dL — ABNORMAL HIGH (ref 0–200)
HDL: 46.6 mg/dL (ref >39.00)
LDL Cholesterol: 172 mg/dL — ABNORMAL HIGH (ref 0–99)
NonHDL: 188.64
Total CHOL/HDL Ratio: 5
Triglycerides: 83 mg/dL (ref 0.0–149.0)
VLDL: 16.6 mg/dL (ref 0.0–40.0)

## 2019-03-18 LAB — HEPATIC FUNCTION PANEL
ALT: 51 U/L (ref 0–53)
AST: 29 U/L (ref 0–37)
Albumin: 4.8 g/dL (ref 3.5–5.2)
Alkaline Phosphatase: 49 U/L (ref 39–117)
Bilirubin, Direct: 0.1 mg/dL (ref 0.0–0.3)
Total Bilirubin: 0.5 mg/dL (ref 0.2–1.2)
Total Protein: 7.9 g/dL (ref 6.0–8.3)

## 2019-03-18 LAB — BASIC METABOLIC PANEL
BUN: 13 mg/dL (ref 6–23)
CO2: 27 mEq/L (ref 19–32)
Calcium: 9.7 mg/dL (ref 8.4–10.5)
Chloride: 104 mEq/L (ref 96–112)
Creatinine, Ser: 0.77 mg/dL (ref 0.40–1.50)
GFR: 119.41 mL/min (ref >60.00)
Glucose, Bld: 88 mg/dL (ref 70–99)
Potassium: 3.7 mEq/L (ref 3.5–5.1)
Sodium: 139 mEq/L (ref 135–145)

## 2019-03-18 LAB — HEMOGLOBIN A1C: Hgb A1c MFr Bld: 5.6 % (ref 4.6–6.5)

## 2019-03-18 LAB — TSH: TSH: 1.76 u[IU]/mL (ref 0.35–4.50)

## 2019-03-18 LAB — VITAMIN D 25 HYDROXY (VIT D DEFICIENCY, FRACTURES): VITD: 11.65 ng/mL — ABNORMAL LOW (ref 30.00–100.00)

## 2019-03-18 MED ORDER — ESOMEPRAZOLE MAGNESIUM 40 MG PO CPDR: 40.0000 mg | DELAYED_RELEASE_CAPSULE | Freq: Every day | ORAL | 1 refills | Status: DC

## 2019-03-18 MED ORDER — INDAPAMIDE 1.25 MG PO TABS
1.2500 mg | ORAL_TABLET | Freq: Every day | ORAL | 0 refills | Status: DC
Start: 2019-03-18 — End: 2021-07-05

## 2019-03-18 NOTE — Progress Notes (Signed)
Subjective:  Patient ID: Jon Krause, male    DOB: 1990/06/02  Age: 29 y.o. MRN: [id number]  CC: Annual Exam and Hypertension  This visit occurred during the SARS-CoV-2 public health emergency.  Safety protocols were in place, including screening questions prior to the visit, additional usage of staff PPE, and extensive cleaning of exam room while observing appropriate contact time as indicated for disinfecting solutions.   HPI Jon Krause presents for a CPX.  He complains of a lesion on the right side of his tongue that he thinks has been there for about a year.  It occasionally feels irritated but does not bother him much otherwise.  He does not think it is growing.  He is also concerned about high blood pressure.  He tells me he was seen at the health department 6 weeks ago for an STI screen and was told that his blood pressure was around 140/90.  He complains of a several month history of weight gain, shortness of breath, dyspnea on exertion, headache, blurred vision, and chest tightness.  He tells me that for the last 6 months he HAs awakened during the night 3 times with sharp chest pain.  He complains of heartburn and intermittent throat clearing.  He denies odynophagia, dysphagia, loss of appetite, or weight loss.   Outpatient Medications Prior to Visit  Medication Sig Dispense Refill   ibuprofen (ADVIL,MOTRIN) 800 MG tablet Take 1 tablet (800 mg total) by mouth 3 (three) times daily. 30 tablet 0   No facility-administered medications prior to visit.    ROS Review of Systems  Constitutional: Positive for unexpected weight change.  HENT: Positive for mouth sores and voice change. Negative for dental problem, facial swelling, sinus pain, sore throat and trouble swallowing.   Eyes: Positive for visual disturbance. Negative for photophobia.  Respiratory: Positive for chest tightness. Negative for apnea, cough, choking, shortness of breath, wheezing and stridor.     Cardiovascular: Positive for chest pain. Negative for palpitations and leg swelling.  Gastrointestinal: Negative for abdominal pain, constipation, diarrhea, nausea and vomiting.  Endocrine: Negative.   Genitourinary: Negative.  Negative for difficulty urinating, dysuria, scrotal swelling, testicular pain and urgency.  Musculoskeletal: Negative.   Skin: Negative.   Neurological: Positive for headaches. Negative for dizziness, tremors, weakness, light-headedness and numbness.  Hematological: Negative for adenopathy. Does not bruise/bleed easily.  Psychiatric/Behavioral: Negative.     Objective:  BP (!) 144/94 (BP Location: Left Arm, Patient Position: Sitting, Cuff Size: Large)    Pulse 60    Temp 99.3 F (37.4 C) (Oral)    Resp 16    Ht 5\' 10"  (1.778 m)    Wt 292 lb (132.5 kg)    SpO2 97%    BMI 41.90 kg/m   BP Readings from Last 3 Encounters:  03/18/19 (!) 144/94  08/19/18 124/71  07/09/17 (!) 141/87    Wt Readings from Last 3 Encounters:  03/18/19 292 lb (132.5 kg)  08/19/18 281 lb 1.4 oz (127.5 kg)  02/14/16 281 lb (127.5 kg)    Physical Exam Vitals reviewed.  Constitutional:      General: He is not in acute distress.    Appearance: He is obese. He is not toxic-appearing or diaphoretic.  HENT:     Nose: Nose normal.     Mouth/Throat:     Comments: Right side of tongue.  Buccal side shows a 0.5 cm raised, fleshy lesion.  See photo. Eyes:     General: No scleral icterus.  Conjunctiva/sclera: Conjunctivae normal.     Pupils: Pupils are equal, round, and reactive to light.  Cardiovascular:     Rate and Rhythm: Normal rate and regular rhythm.     Heart sounds: No murmur. No gallop.      Comments: EKG ---  NSR, No LVH. Normal EKG. Pulmonary:     Effort: Pulmonary effort is normal. No respiratory distress.     Breath sounds: No stridor. No wheezing, rhonchi or rales.  Abdominal:     General: Abdomen is protuberant. Bowel sounds are normal. There is no distension.      Palpations: Abdomen is soft. There is no hepatomegaly or splenomegaly.     Tenderness: There is no abdominal tenderness.     Hernia: No hernia is present.  Musculoskeletal:        General: Normal range of motion.     Cervical back: Neck supple. No tenderness.     Right lower leg: No edema.     Left lower leg: No edema.  Lymphadenopathy:     Cervical: No cervical adenopathy.  Skin:    General: Skin is warm and dry.     Findings: No rash.  Neurological:     General: No focal deficit present.     Mental Status: He is alert and oriented to person, place, and time. Mental status is at baseline.  Psychiatric:        Mood and Affect: Mood normal.        Behavior: Behavior normal.        Thought Content: Thought content normal.        Judgment: Judgment normal.     Lab Results  Component Value Date   WBC 8.6 08/19/2018   HGB 14.7 08/19/2018   HCT 45.3 08/19/2018   PLT 330 08/19/2018   GLUCOSE 88 03/18/2019   CHOL 235 (H) 03/18/2019   TRIG 83.0 03/18/2019   HDL 46.60 03/18/2019   LDLCALC 172 (H) 03/18/2019   ALT 51 03/18/2019   AST 29 03/18/2019   NA 139 03/18/2019   K 3.7 03/18/2019   CL 104 03/18/2019   CREATININE 0.77 03/18/2019   BUN 13 03/18/2019   CO2 27 03/18/2019   TSH 1.76 03/18/2019   HGBA1C 5.6 03/18/2019    MR BRAIN W WO CONTRAST  Result Date: 10/01/2018  [facility] [address] [place], [place] [address] [phone] NEUROIMAGING REPORT STUDY DATE: 09/30/2018 PATIENT NAME: Jon Krause DOB: [birth year] MRN: [id number] EXAM: MRI Brain with and without contrast ORDERING CLINICIAN: Joycelyn Schmid MD CLINICAL HISTORY: 29 year old man with numbness COMPARISON FILMS: None TECHNIQUE:MRI of the brain with and without contrast was obtained utilizing 5 mm axial slices with T1, T2, T2 flair, SWI and diffusion weighted views.  T1 sagittal, T2 coronal and postcontrast views in the axial and coronal plane were obtained. CONTRAST: 20 ml Multihance  IMAGING SITE: [employer], [address] [place]. FINDINGS: On sagittal images, the spinal cord is imaged caudally to C3 and is normal in caliber.   The contents of the posterior fossa are of normal size and position.   The pituitary gland and optic chiasm appear normal.    Brain volume appears normal.   The ventricles are normal in size and without distortion.  There are no abnormal extra-axial collections of fluid.  The cerebellum and brainstem appears normal.   The deep gray matter appears normal.  The cerebral hemispheres appear normal.   Diffusion weighted images are normal.  Susceptibility weighted  images are normal.   The orbits appear normal.   The VIIth/VIIIth nerve complex appears normal.  The mastoid air cells appear normal.  The paranasal sinuses appear normal.  Flow voids are identified within the major intracerebral arteries.  After the infusion of contrast material, a normal enhancement pattern is noted.     This is a normal MRI of the brain with and without contrast INTERPRETING PHYSICIAN: Richard A. Epimenio Foot, MD, PhD, FAAN Certified in  Neuroimaging by [employer]    Assessment & Plan:   Jon Krause was seen today for annual exam and hypertension.  Diagnoses and all orders for this visit:  Essential hypertension- he has developed stage I/II hypertension.  His EKG is negative for LVH.  His labs are negative for secondary causes.  He agrees to improve his lifestyle modifications.  I have asked him to start taking a thiazide diuretic to treat this. -     Vitamin D 25 hydroxy -     TSH -     Urinalysis, Routine w reflex microscopic -     Basic metabolic panel -     indapamide (LOZOL) 1.25 MG tablet; Take 1 tablet (1.25 mg total) by mouth daily. -     EKG 12-Lead  Need for influenza vaccination -     Flu Vaccine QUAD 36+ mos IM  Need for Tdap vaccination -     Tdap vaccine greater than or equal to 7yo IM  Class 3 severe obesity due to excess calories with  serious comorbidity and body mass index (BMI) of 40.0 to 44.9 in adult [facility])- Labs are negative for secondary causes or complications.  He has developed hypertension.  He agrees to improve his lifestyle modifications. -     Hemoglobin A1c -     Hepatic function panel  Routine general medical examination at a health care facility- Exam completed, labs reviewed, vaccines reviewed and updated, patient education was given. -     Lipid panel  Lesion of tongue- I have asked him to see oral surgery to consider having this biopsied. -     Ambulatory referral to Oral Maxillofacial Surgery  Gastroesophageal reflux disease without esophagitis- He is symptomatic with this.  I recommended that he start taking a PPI. -     esomeprazole (NEXIUM) 40 MG capsule; Take 1 capsule (40 mg total) by mouth daily at 12 noon.  Vitamin D deficiency -     Cholecalciferol 50 MCG (2000 UT) TABS; Take 2 tablets (4,000 Units total) by mouth daily.   I have discontinued Keilan Greif's ibuprofen. I am also having him start on indapamide, esomeprazole, and Cholecalciferol.  Meds ordered this encounter  Medications   indapamide (LOZOL) 1.25 MG tablet    Sig: Take 1 tablet (1.25 mg total) by mouth daily.    Dispense:  90 tablet    Refill:  0   esomeprazole (NEXIUM) 40 MG capsule    Sig: Take 1 capsule (40 mg total) by mouth daily at 12 noon.    Dispense:  90 capsule    Refill:  1   Cholecalciferol 50 MCG (2000 UT) TABS    Sig: Take 2 tablets (4,000 Units total) by mouth daily.    Dispense:  180 tablet    Refill:  1     Follow-up: Return in about 3 months (around 06/16/2019).  Sanda Linger, MD

## 2019-03-18 NOTE — Patient Instructions (Signed)

## 2019-03-19 ENCOUNTER — Encounter: Payer: Self-pay | Admitting: Internal Medicine

## 2019-03-19 DIAGNOSIS — E559 Vitamin D deficiency, unspecified: Secondary | ICD-10-CM | POA: Insufficient documentation

## 2019-03-19 MED ORDER — CHOLECALCIFEROL 50 MCG (2000 UT) PO TABS: 2.0000 | ORAL_TABLET | Freq: Every day | ORAL | 1 refills | Status: DC

## 2019-03-20 ENCOUNTER — Encounter: Payer: Self-pay | Admitting: Internal Medicine

## 2019-05-01 ENCOUNTER — Ambulatory Visit: Payer: BLUE CROSS/BLUE SHIELD | Attending: Internal Medicine

## 2019-05-01 DIAGNOSIS — Z23 Encounter for immunization: Secondary | ICD-10-CM | POA: Insufficient documentation

## 2019-05-01 NOTE — Progress Notes (Signed)
   Covid-19 Vaccination Clinic  Name:  [name]    MRN: [id number] DOB: 1990-10-10  05/01/2019  Mr. Hersh was observed post Covid-19 immunization for 15 minutes without incidence. He was provided with Vaccine Information Sheet and instruction to access the V-Safe system.   Mr. Defalco was instructed to call 911 with any severe reactions post vaccine: Marland Kitchen Difficulty breathing  . Swelling of your face and throat  . A fast heartbeat  . A bad rash all over your body  . Dizziness and weakness    Immunizations Administered    Name Date Dose VIS Date Route   Pfizer COVID-19 Vaccine 05/01/2019 12:34 PM 0.3 mL 02/19/2019 Intramuscular   Manufacturer: [employer], [employer]   Lot: [id number]   NDC: [address]-1

## 2019-05-25 ENCOUNTER — Ambulatory Visit: Payer: Self-pay | Attending: Internal Medicine

## 2019-05-25 ENCOUNTER — Ambulatory Visit: Payer: Self-pay

## 2019-05-25 DIAGNOSIS — Z23 Encounter for immunization: Secondary | ICD-10-CM

## 2019-05-25 NOTE — Progress Notes (Signed)
   Covid-19 Vaccination Clinic  Name:  Jon Krause    MRN: [id number] DOB: 05-27-1990  05/25/2019  Mr. Swab was observed post Covid-19 immunization for 15 minutes without incident. He was provided with Vaccine Information Sheet and instruction to access the V-Safe system.   [name] was instructed to call 911 with any severe reactions post vaccine: Marland Kitchen Difficulty breathing  . Swelling of face and throat  . A fast heartbeat  . A bad rash all over body  . Dizziness and weakness   Immunizations Administered    Name Date Dose VIS Date Route   Pfizer COVID-19 Vaccine 05/25/2019  1:38 PM 0.3 mL 02/19/2019 Intramuscular   Manufacturer: [employer], [employer]   Lot: [id number]   NDC: [address]-3

## 2019-06-17 ENCOUNTER — Ambulatory Visit: Payer: BLUE CROSS/BLUE SHIELD | Admitting: Internal Medicine

## 2019-08-16 ENCOUNTER — Telehealth: Payer: Self-pay

## 2019-08-16 NOTE — Telephone Encounter (Signed)
Pt contacted and informed of same.  

## 2019-08-16 NOTE — Telephone Encounter (Signed)
Closed referral from beginning of year because he never reached back out to me.  Per telephone note he is already scheduled.  I opened referral back up and put appt info in it.  Fax[facility] referral and notes to [facility]

## 2019-08-16 NOTE — Telephone Encounter (Signed)
New message    Patient voiced this was discuss at his last office visit bump on his tongue Need a referral oral surgeon  Appt : Thursday, June 17 @ 2:30  Referral: Dr. Lavonna Monarch   [facility] : [facility]  Phone # [phone] Fax # [phone]

## 2019-08-23 ENCOUNTER — Ambulatory Visit: Payer: Self-pay | Admitting: Internal Medicine

## 2019-08-23 DIAGNOSIS — Z0289 Encounter for other administrative examinations: Secondary | ICD-10-CM

## 2021-05-16 ENCOUNTER — Ambulatory Visit (HOSPITAL_COMMUNITY)
Admission: EM | Admit: 2021-05-16 | Discharge: 2021-05-16 | Disposition: A | Discharge status: Home / Self Care | Payer: BC Managed Care – PPO | Attending: Nurse Practitioner | Admitting: Nurse Practitioner

## 2021-05-16 ENCOUNTER — Encounter (HOSPITAL_COMMUNITY): Payer: Self-pay | Admitting: Emergency Medicine

## 2021-05-16 ENCOUNTER — Other Ambulatory Visit: Payer: Self-pay

## 2021-05-16 DIAGNOSIS — J029 Acute pharyngitis, unspecified: Secondary | ICD-10-CM | POA: Insufficient documentation

## 2021-05-16 DIAGNOSIS — Z113 Encounter for screening for infections with a predominantly sexual mode of transmission: Secondary | ICD-10-CM | POA: Insufficient documentation

## 2021-05-16 DIAGNOSIS — J069 Acute upper respiratory infection, unspecified: Secondary | ICD-10-CM | POA: Insufficient documentation

## 2021-05-16 LAB — POCT RAPID STREP A, ED / UC: Streptococcus, Group A Screen (Direct): NEGATIVE

## 2021-05-16 LAB — HIV ANTIBODY (ROUTINE TESTING W REFLEX): HIV Screen 4th Generation wRfx: NONREACTIVE

## 2021-05-16 NOTE — ED Triage Notes (Signed)
Pt reports cough, sore throat, nasal congestion x 1 week and left ear feeling clogged. Pt states it hurts to swallow and he has coughing attacks at night.  ?Also requesting STD testing.  ?

## 2021-05-16 NOTE — Discharge Instructions (Addendum)
Your strep test today is negative.  We will let you know if the throat culture comes back positive and requires treatment.  We will let you know if any of the other testing comes back positive as well.   ?Your viral symptoms should improve over the next week or so.  If they do not slowly improve, you can follow up with your primary care provider or you can return here.  ?Some things that can make you feel better are: ?- Increased rest ?- Increasing fluid with water/sugar free electrolytes ?- Acetaminophen as needed for fever/pain.  ?- Salt water gargling, chloraseptic spray and throat lozenges ?- OTC guaifenesin (Mucinex).  ?- Saline sinus flushes or a neti pot.  ?- Humidifying the air. ? ?

## 2021-05-16 NOTE — ED Provider Notes (Signed)
[facility]    CSN: [id number] Arrival date & time: 05/16/21  1411      History   Chief Complaint Chief Complaint  Patient presents with   Cough   Nasal Congestion   Sore Throat    HPI Jon Krause is a 31 y.o. male.   Patient reports about 1 week ago, symptoms of GI bug started.  He was having abdominal pain, diarrhea, and nausea.  This resolved after a couple of days and on Sunday, a fever developed of 101.  He also reports body aches, chills, cough with congestion, chest pain with cough, ear pain, and shortness of breath when laying flat.  Has has hard a hard time sleeping because of the cough at night time.  His appetite has been decreased and he is more tired than normal.  He has been using vicks vapo rub and Mucinex with minimal relief of symptoms.    He reports he took an at-home COVID test that was negative.  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   He is also requesting STI screening today.  Reports during sexual intercourse recently, the condom broke.  Denies any new genital lesions, sores, rashes.     Past Medical History:  Diagnosis Date   Anxiety    Depression    Insomnia    Obesity     Patient Active Problem List   Diagnosis Date Noted   Vitamin D deficiency 03/19/2019   Class 3 severe obesity due to excess calories with serious comorbidity and body mass index (BMI) of 40.0 to 44.9 in adult [facility]) 03/18/2019   Need for Tdap vaccination 03/18/2019   Need for influenza vaccination 03/18/2019   Essential hypertension 03/18/2019   Routine general medical examination at a health care facility 03/18/2019   Lesion of tongue 03/18/2019   Gastroesophageal reflux disease without esophagitis 03/18/2019   High risk sexual behavior 02/14/2016   Depression     History reviewed. No pertinent surgical history.     Home Medications    Prior to Admission medications   Medication Sig Start Date End Date Taking? Authorizing Provider   Cholecalciferol 50 MCG (2000 UT) TABS Take 2 tablets (4,000 Units total) by mouth daily. 03/19/19   Etta Grandchild, MD  esomeprazole (NEXIUM) 40 MG capsule Take 1 capsule (40 mg total) by mouth daily at 12 noon. 03/18/19   Etta Grandchild, MD  indapamide (LOZOL) 1.25 MG tablet Take 1 tablet (1.25 mg total) by mouth daily. 03/18/19   Etta Grandchild, MD    Family History Family History  Problem Relation Age of Onset   Seizures Father        grand mal   Prostate cancer Father    Thyroid disease Mother    Cancer Paternal Aunt        colon   Cancer Maternal Aunt    Alcohol abuse Neg Hx    Depression Neg Hx    Diabetes Neg Hx    Drug abuse Neg Hx    Heart disease Neg Hx    Hyperlipidemia Neg Hx    Hypertension Neg Hx    Kidney disease Neg Hx    Stroke Neg Hx     Social History Social History   Tobacco Use   Smoking status: Former    Packs/day: 0.25    Years: 0.50    Pack years: 0.13    Types: Cigarettes    Quit date: 08/24/2013  Years since quitting: 7.7   Smokeless tobacco: Never  Substance Use Topics   Alcohol use: Yes    Alcohol/week: 3.0 standard drinks    Types: 3 Cans of beer per week   Drug use: No     Allergies   Patient has no known allergies.   Review of Systems Review of Systems Per HPI  Physical Exam Triage Vital Signs ED Triage Vitals  Enc Vitals Group     BP --      Pulse Rate 05/16/21 1449 85     Resp 05/16/21 1449 18     Temp 05/16/21 1449 98.4 F (36.9 C)     Temp Source 05/16/21 1449 Oral     SpO2 05/16/21 1449 94 %     Weight 05/16/21 1448 292 lb 1.8 oz (132.5 kg)     Height 05/16/21 1448 5\' 10"  (1.778 m)     Head Circumference --      Peak Flow --      Pain Score 05/16/21 1448 0     Pain Loc --      Pain Edu? --      Excl. in GC? --    No data found.  Updated Vital Signs Pulse 85    Temp 98.4 F (36.9 C) (Oral)    Resp 18    Ht 5\' 10"  (1.778 m)    Wt 292 lb 1.8 oz (132.5 kg)    SpO2 94%    BMI 41.91 kg/m   Visual  Acuity Right Eye Distance:   Left Eye Distance:   Bilateral Distance:    Right Eye Near:   Left Eye Near:    Bilateral Near:     Physical Exam Vitals and nursing note reviewed.  Constitutional:      General: He is not in acute distress.    Appearance: Normal appearance. He is not ill-appearing or toxic-appearing.  HENT:     Head: Normocephalic and atraumatic.     Right Ear: Tympanic membrane, ear canal and external ear normal.     Left Ear: Ear canal and external ear normal. A middle ear effusion is present.     Nose: Congestion present. No rhinorrhea.     Right Sinus: No maxillary sinus tenderness or frontal sinus tenderness.     Left Sinus: No maxillary sinus tenderness or frontal sinus tenderness.     Mouth/Throat:     Mouth: Mucous membranes are moist.     Pharynx: Oropharynx is clear. Posterior oropharyngeal erythema present. No oropharyngeal exudate.     Tonsils: No tonsillar exudate. 0 on the right. 0 on the left.  Eyes:     General: No scleral icterus.    Extraocular Movements: Extraocular movements intact.  Cardiovascular:     Rate and Rhythm: Normal rate and regular rhythm.  Pulmonary:     Effort: Pulmonary effort is normal. No respiratory distress.     Breath sounds: Normal breath sounds. No wheezing, rhonchi or rales.  Abdominal:     General: Abdomen is flat. Bowel sounds are normal. There is no distension.     Palpations: Abdomen is soft.  Genitourinary:    Comments: Deferred Musculoskeletal:     Cervical back: Normal range of motion and neck supple.  Lymphadenopathy:     Cervical: No cervical adenopathy.  Skin:    General: Skin is warm and dry.     Coloration: Skin is not jaundiced or pale.     Findings: No erythema or rash.  Neurological:  Mental Status: He is alert and oriented to person, place, and time.     Motor: No weakness.  Psychiatric:        Mood and Affect: Mood normal.        Behavior: Behavior normal.     UC Treatments / Results   Labs (all labs ordered are listed, but only abnormal results are displayed) Labs Reviewed  CULTURE, GROUP A STREP ([facility])  RPR  HIV ANTIBODY (ROUTINE TESTING W REFLEX)  POCT RAPID STREP A, ED / UC  CYTOLOGY, (ORAL, ANAL, URETHRAL) ANCILLARY ONLY  CYTOLOGY, (ORAL, ANAL, URETHRAL) ANCILLARY ONLY    EKG   Radiology No results found.  Procedures Procedures (including critical care time)  Medications Ordered in UC Medications - No data to display  Initial Impression / Assessment and Plan / UC Course  I have reviewed the triage vital signs and the nursing notes.  Pertinent labs & imaging results that were available during my care of the patient were reviewed by me and considered in my medical decision making (see chart for details).    Rapid strep test today is negative; will send for throat culture given sore throat and hoarseness, however low suspicion for streptococcal pharyngitis.  Additionally, he had a negative at home COVID test and works a remote job.  Reassured patient that symptoms and exam findings are most consistent with a viral upper respiratory infection and explained lack of efficacy of antibiotics against viruses.  Discussed expected course and features suggestive of secondary bacterial infection.  Continue supportive care. Increase fluid intake with water or electrolyte solution like pedialyte. Encouraged acetaminophen as needed for fever/pain. Encouraged salt water gargling, chloraseptic spray and throat lozenges. Encouraged OTC guaifenesin. Encouraged saline sinus flushes and/or neti with humidified air.  Start promethazine-dextromethorphan at night time for dry cough.  Note given for work.  Will also check oral and urethral cytology for STI.  Will notify with positive results and treat as indicated.   Final Clinical Impressions(s) / UC Diagnoses   Final diagnoses:  Viral URI with cough  Acute pharyngitis, unspecified etiology  Routine screening for STI (sexually  transmitted infection)     Discharge Instructions      Your strep test today is negative.  We will let you know if the throat culture comes back positive and requires treatment.  We will let you know if any of the other testing comes back positive as well.   Your viral symptoms should improve over the next week or so.  If they do not slowly improve, you can follow up with your primary care provider or you can return here.  Some things that can make you feel better are: - Increased rest - Increasing fluid with water/sugar free electrolytes - Acetaminophen as needed for fever/pain.  - Salt water gargling, chloraseptic spray and throat lozenges - OTC guaifenesin (Mucinex).  - Saline sinus flushes or a neti pot.  - Humidifying the air.      ED Prescriptions   None    PDMP not reviewed this encounter.   Valentino NoseMartinez, Sharlisa Hollifield A, NP 05/16/21 1627

## 2021-05-17 LAB — CYTOLOGY, (ORAL, ANAL, URETHRAL) ANCILLARY ONLY
Chlamydia: NEGATIVE
Comment: NEGATIVE
Comment: NEGATIVE
Comment: NORMAL
Neisseria Gonorrhea: NEGATIVE
Trichomonas: NEGATIVE

## 2021-05-17 LAB — RPR: RPR Ser Ql: NONREACTIVE

## 2021-05-18 LAB — CYTOLOGY, (ORAL, ANAL, URETHRAL) ANCILLARY ONLY
Chlamydia: NEGATIVE
Comment: NEGATIVE
Comment: NEGATIVE
Comment: NORMAL
Neisseria Gonorrhea: NEGATIVE
Trichomonas: NEGATIVE

## 2021-05-19 LAB — CULTURE, GROUP A STREP (THRC)

## 2021-05-26 ENCOUNTER — Other Ambulatory Visit: Payer: Self-pay

## 2021-05-26 ENCOUNTER — Encounter (HOSPITAL_COMMUNITY): Payer: Self-pay | Admitting: *Deleted

## 2021-05-26 ENCOUNTER — Ambulatory Visit (HOSPITAL_COMMUNITY)
Admission: EM | Admit: 2021-05-26 | Discharge: 2021-05-26 | Disposition: A | Discharge status: Home / Self Care | Payer: BC Managed Care – PPO | Attending: Urgent Care | Admitting: Urgent Care

## 2021-05-26 DIAGNOSIS — R0981 Nasal congestion: Secondary | ICD-10-CM

## 2021-05-26 DIAGNOSIS — H6592 Unspecified nonsuppurative otitis media, left ear: Secondary | ICD-10-CM | POA: Diagnosis not present

## 2021-05-26 DIAGNOSIS — J0140 Acute pansinusitis, unspecified: Secondary | ICD-10-CM | POA: Diagnosis not present

## 2021-05-26 MED ORDER — CETIRIZINE HCL 10 MG PO TABS: 10.0000 mg | ORAL_TABLET | Freq: Every day | ORAL | 0 refills | Status: AC

## 2021-05-26 MED ORDER — AMOXICILLIN-POT CLAVULANATE 875-125 MG PO TABS: 1.0000 | ORAL_TABLET | Freq: Two times a day (BID) | ORAL | 0 refills | Status: AC

## 2021-05-26 NOTE — ED Triage Notes (Signed)
Pt was seen and treated for virus and era infection . Pt continues to have a sore throat on RT side only sharpe feeling. Pt also reports Lt ear feels muffled . He has been using ear gtts  but has stopped. Pt reports hearing a popping in Lt ear. ?

## 2021-05-26 NOTE — ED Provider Notes (Signed)
?[facility] ? ? ? ?CSN: [id number] ?Arrival date & time: 05/26/21  1624 ? ? ?  ? ?History   ?Chief Complaint ?Chief Complaint  ?Patient presents with  ? Sore Throat  ? ear pressure  ? ? ?HPI ?Jon Krause is a 31 y.o. male.  ? ?Pleasant 31 year old male presents today with concerns of having a "cold" for the past 3 weeks.  He was seen 10 days ago, and was told he had a viral URI.  He has been taking over-the-counter medications including Mucinex.  He started developing left ear pain and asked the pharmacist what to purchase.  He has been using topical Debrox without symptom resolution, and the development of "popping in his ears".  He is concerned that he has been sick for greater than 3 weeks without any improvement to his symptoms.  He notes sinus pain pressure, nasal congestion, drainage, and the newest symptom of left ear pain.  He had strep testing last visit which was negative.  He also had STI testing which was also negative. He denies additional complaints today. ? ? ?Sore Throat ? ? ?Past Medical History:  ?Diagnosis Date  ? Anxiety   ? Depression   ? Insomnia   ? Obesity   ? ? ?Patient Active Problem List  ? Diagnosis Date Noted  ? Vitamin D deficiency 03/19/2019  ? Class 3 severe obesity due to excess calories with serious comorbidity and body mass index (BMI) of 40.0 to 44.9 in adult [facility]) 03/18/2019  ? Need for Tdap vaccination 03/18/2019  ? Need for influenza vaccination 03/18/2019  ? Essential hypertension 03/18/2019  ? Routine general medical examination at a health care facility 03/18/2019  ? Lesion of tongue 03/18/2019  ? Gastroesophageal reflux disease without esophagitis 03/18/2019  ? High risk sexual behavior 02/14/2016  ? Depression   ? ? ?History reviewed. No pertinent surgical history. ? ? ? ? ?Home Medications   ? ?Prior to Admission medications   ?Medication Sig Start Date End Date Taking? Authorizing Provider  ?amoxicillin-clavulanate (AUGMENTIN) 875-125 MG tablet Take 1 tablet  by mouth 2 (two) times daily for 10 days. 05/26/21 06/05/21 Yes Mercer Peifer L, PA  ?cetirizine (ZYRTEC) 10 MG tablet Take 1 tablet (10 mg total) by mouth daily. 05/26/21  Yes Carmeline Kowal L, PA  ?Cholecalciferol 50 MCG (2000 UT) TABS Take 2 tablets (4,000 Units total) by mouth daily. 03/19/19   Etta Grandchild, MD  ?esomeprazole (NEXIUM) 40 MG capsule Take 1 capsule (40 mg total) by mouth daily at 12 noon. 03/18/19   Etta Grandchild, MD  ?indapamide (LOZOL) 1.25 MG tablet Take 1 tablet (1.25 mg total) by mouth daily. 03/18/19   Etta Grandchild, MD  ? ? ?Family History ?Family History  ?Problem Relation Age of Onset  ? Seizures Father   ?     grand mal  ? Prostate cancer Father   ? Thyroid disease Mother   ? Cancer Paternal Aunt   ?     colon  ? Cancer Maternal Aunt   ? Alcohol abuse Neg Hx   ? Depression Neg Hx   ? Diabetes Neg Hx   ? Drug abuse Neg Hx   ? Heart disease Neg Hx   ? Hyperlipidemia Neg Hx   ? Hypertension Neg Hx   ? Kidney disease Neg Hx   ? Stroke Neg Hx   ? ? ?Social History ?Social History  ? ?Tobacco Use  ? Smoking status: Former  ?  Packs/day:  0.25  ?  Years: 0.50  ?  Pack years: 0.13  ?  Types: Cigarettes  ?  Quit date: 08/24/2013  ?  Years since quitting: 7.7  ? Smokeless tobacco: Never  ?Substance Use Topics  ? Alcohol use: Yes  ?  Alcohol/week: 3.0 standard drinks  ?  Types: 3 Cans of beer per week  ? Drug use: No  ? ? ? ?Allergies   ?Patient has no known allergies. ? ? ?Review of Systems ?Review of Systems  ?HENT:  Positive for congestion, ear pain (L), rhinorrhea, sinus pressure and sinus pain.   ?All other systems reviewed and are negative. ? ? ?Physical Exam ?Triage Vital Signs ?ED Triage Vitals  ?Enc Vitals Group  ?   BP 05/26/21 1655 (!) 142/80  ?   Pulse Rate 05/26/21 1655 82  ?   Resp 05/26/21 1655 18  ?   Temp 05/26/21 1655 98.7 ?F (37.1 ?C)  ?   Temp src --   ?   SpO2 05/26/21 1655 94 %  ?   Weight --   ?   Height --   ?   Head Circumference --   ?   Peak Flow --   ?   Pain Score 05/26/21  1653 2  ?   Pain Loc --   ?   Pain Edu? --   ?   Excl. in GC? --   ? ?No data found. ? ?Updated Vital Signs ?BP (!) 142/80   Pulse 82   Temp 98.7 ?F (37.1 ?C)   Resp 18   SpO2 94%  ? ?Visual Acuity ?Right Eye Distance:   ?Left Eye Distance:   ?Bilateral Distance:   ? ?Right Eye Near:   ?Left Eye Near:    ?Bilateral Near:    ? ?Physical Exam ?Vitals and nursing note reviewed.  ?Constitutional:   ?   General: He is not in acute distress. ?   Appearance: Normal appearance. He is not ill-appearing or toxic-appearing.  ?HENT:  ?   Head: Normocephalic and atraumatic.  ?   Right Ear: Tympanic membrane, ear canal and external ear normal. No drainage, swelling or tenderness. No middle ear effusion. Tympanic membrane is not erythematous.  ?   Left Ear: Ear canal and external ear normal. A middle ear effusion is present. Tympanic membrane is erythematous.  ?   Ears:  ?   Comments: Loss of light reflex on L, thick mucoid effusion on L ?   Nose: Congestion and rhinorrhea present.  ?   Right Sinus: No maxillary sinus tenderness or frontal sinus tenderness.  ?   Left Sinus: No maxillary sinus tenderness or frontal sinus tenderness.  ?   Mouth/Throat:  ?   Mouth: Mucous membranes are moist. No oral lesions.  ?   Pharynx: Oropharynx is clear. Uvula midline. No pharyngeal swelling, oropharyngeal exudate, posterior oropharyngeal erythema or uvula swelling.  ?   Tonsils: No tonsillar exudate or tonsillar abscesses.  ?Eyes:  ?   General: No scleral icterus. ?   Extraocular Movements: Extraocular movements intact.  ?   Right eye: Normal extraocular motion.  ?   Left eye: Normal extraocular motion.  ?   Conjunctiva/sclera: Conjunctivae normal.  ?   Pupils: Pupils are equal, round, and reactive to light.  ?Cardiovascular:  ?   Rate and Rhythm: Normal rate and regular rhythm.  ?   Heart sounds: Normal heart sounds. No murmur heard. ?Pulmonary:  ?   Effort: Pulmonary effort is  normal. No respiratory distress.  ?   Breath sounds: Normal  breath sounds. No stridor. No wheezing, rhonchi or rales.  ?Chest:  ?   Chest wall: No tenderness.  ?Abdominal:  ?   General: Abdomen is flat. Bowel sounds are normal. There is no distension.  ?   Palpations: Abdomen is soft.  ?Musculoskeletal:  ?   Cervical back: Normal range of motion and neck supple.  ?Lymphadenopathy:  ?   Cervical: No cervical adenopathy.  ?Skin: ?   General: Skin is warm and dry.  ?   Capillary Refill: Capillary refill takes less than 2 seconds.  ?   Coloration: Skin is not jaundiced or pale.  ?   Findings: No erythema or rash.  ?Neurological:  ?   General: No focal deficit present.  ?   Mental Status: He is alert and oriented to person, place, and time.  ?   Motor: No weakness.  ?Psychiatric:     ?   Mood and Affect: Mood normal.     ?   Behavior: Behavior normal.  ? ? ? ?UC Treatments / Results  ?Labs ?(all labs ordered are listed, but only abnormal results are displayed) ?Labs Reviewed - No data to display ? ?EKG ? ? ?Radiology ?No results found. ? ?Procedures ?Procedures (including critical care time) ? ?Medications Ordered in UC ?Medications - No data to display ? ?Initial Impression / Assessment and Plan / UC Course  ?I have reviewed the triage vital signs and the nursing notes. ? ?Pertinent labs & imaging results that were available during my care of the patient were reviewed by me and considered in my medical decision making (see chart for details). ? ?  ? ?L OM with effusion - will start abx, augmentin chosen secondary to developing sinusitis as well. Will cover all pathogens. Strep testing performed last visit negative. ?Sinusitis - abx as above. Humidification and saline rinses may be beneficial ?Nasal congestion - will add daily cetirizine for sx relief. Likely the cause of #1 and 2. ? ?Final Clinical Impressions(s) / UC Diagnoses  ? ?Final diagnoses:  ?Left otitis media with effusion  ?Acute non-recurrent pansinusitis  ?Nasal congestion  ? ? ? ?Discharge Instructions   ? ?   ?Please start taking your antibiotic twice daily with food to prevent an upset stomach or diarrhea.  You may consider taking an over-the-counter probiotic such as Culturelle or eating a daily yogurt. ?Start tak

## 2021-05-26 NOTE — Discharge Instructions (Addendum)
Please start taking your antibiotic twice daily with food to prevent an upset stomach or diarrhea.  You may consider taking an over-the-counter probiotic such as Culturelle or eating a daily yogurt. ?Start taking cetirizine to help dry up the fluid component. ?Consider steaming up a shower or using a vaporizer to help with your congestion. ?Stop using the drops you purchased over-the-counter in your ear.  This is for earwax blockages only. ?RTC for any new or worsening symptoms. ?

## 2021-07-05 ENCOUNTER — Encounter: Payer: Self-pay | Admitting: Internal Medicine

## 2021-07-05 ENCOUNTER — Telehealth: Payer: Self-pay

## 2021-07-05 ENCOUNTER — Ambulatory Visit (INDEPENDENT_AMBULATORY_CARE_PROVIDER_SITE_OTHER): Payer: BC Managed Care – PPO | Admitting: Internal Medicine

## 2021-07-05 VITALS — BP 140/88 | HR 88 | Temp 98.0°F | Ht 70.0 in | Wt 315.0 lb

## 2021-07-05 DIAGNOSIS — I10 Essential (primary) hypertension: Secondary | ICD-10-CM

## 2021-07-05 DIAGNOSIS — E559 Vitamin D deficiency, unspecified: Secondary | ICD-10-CM

## 2021-07-05 DIAGNOSIS — Z6841 Body Mass Index (BMI) 40.0 and over, adult: Secondary | ICD-10-CM

## 2021-07-05 DIAGNOSIS — R202 Paresthesia of skin: Secondary | ICD-10-CM | POA: Diagnosis not present

## 2021-07-05 DIAGNOSIS — Z0000 Encounter for general adult medical examination without abnormal findings: Secondary | ICD-10-CM

## 2021-07-05 LAB — LIPID PANEL
Cholesterol: 228 mg/dL — ABNORMAL HIGH (ref 0–200)
HDL: 45.2 mg/dL (ref >39.00)
LDL Cholesterol: 162 mg/dL — ABNORMAL HIGH (ref 0–99)
NonHDL: 182.5
Total CHOL/HDL Ratio: 5
Triglycerides: 104 mg/dL (ref 0.0–149.0)
VLDL: 20.8 mg/dL (ref 0.0–40.0)

## 2021-07-05 LAB — VITAMIN B12: Vitamin B-12: 345 pg/mL (ref 211–911)

## 2021-07-05 LAB — HEPATIC FUNCTION PANEL
ALT: 53 U/L (ref 0–53)
AST: 28 U/L (ref 0–37)
Albumin: 4.5 g/dL (ref 3.5–5.2)
Alkaline Phosphatase: 52 U/L (ref 39–117)
Bilirubin, Direct: 0.1 mg/dL (ref 0.0–0.3)
Total Bilirubin: 0.6 mg/dL (ref 0.2–1.2)
Total Protein: 7.6 g/dL (ref 6.0–8.3)

## 2021-07-05 LAB — URINALYSIS, ROUTINE W REFLEX MICROSCOPIC
Bilirubin Urine: NEGATIVE
Hgb urine dipstick: NEGATIVE
Ketones, ur: NEGATIVE
Leukocytes,Ua: NEGATIVE
Nitrite: NEGATIVE
RBC / HPF: NONE SEEN (ref 0–?)
Specific Gravity, Urine: 1.025 (ref 1.000–1.030)
Total Protein, Urine: NEGATIVE
Urine Glucose: NEGATIVE
Urobilinogen, UA: 0.2 (ref 0.0–1.0)
pH: 6 (ref 5.0–8.0)

## 2021-07-05 LAB — BASIC METABOLIC PANEL
BUN: 10 mg/dL (ref 6–23)
CO2: 27 mEq/L (ref 19–32)
Calcium: 9.3 mg/dL (ref 8.4–10.5)
Chloride: 103 mEq/L (ref 96–112)
Creatinine, Ser: 0.78 mg/dL (ref 0.40–1.50)
GFR: 118.97 mL/min (ref >60.00)
Glucose, Bld: 93 mg/dL (ref 70–99)
Potassium: 4.2 mEq/L (ref 3.5–5.1)
Sodium: 139 mEq/L (ref 135–145)

## 2021-07-05 LAB — CBC WITH DIFFERENTIAL/PLATELET
Basophils Absolute: 0.1 10*3/uL (ref 0.0–0.1)
Basophils Relative: 0.8 % (ref 0.0–3.0)
Eosinophils Absolute: 0.3 10*3/uL (ref 0.0–0.7)
Eosinophils Relative: 3.8 % (ref 0.0–5.0)
HCT: 42.1 % (ref 39.0–52.0)
Hemoglobin: 14 g/dL (ref 13.0–17.0)
Lymphocytes Relative: 41.7 % (ref 12.0–46.0)
Lymphs Abs: 3.5 10*3/uL (ref 0.7–4.0)
MCHC: 33.3 g/dL (ref 30.0–36.0)
MCV: 80.1 fl (ref 78.0–100.0)
Monocytes Absolute: 0.7 10*3/uL (ref 0.1–1.0)
Monocytes Relative: 8.4 % (ref 3.0–12.0)
Neutro Abs: 3.7 10*3/uL (ref 1.4–7.7)
Neutrophils Relative %: 45.3 % (ref 43.0–77.0)
Platelets: 339 10*3/uL (ref 150.0–400.0)
RBC: 5.26 Mil/uL (ref 4.22–5.81)
RDW: 15 % (ref 11.5–15.5)
WBC: 8.3 10*3/uL (ref 4.0–10.5)

## 2021-07-05 LAB — FOLATE: Folate: 9.9 ng/mL (ref >5.9)

## 2021-07-05 LAB — TSH: TSH: 2.84 u[IU]/mL (ref 0.35–5.50)

## 2021-07-05 LAB — HEMOGLOBIN A1C: Hgb A1c MFr Bld: 5.6 % (ref 4.6–6.5)

## 2021-07-05 MED ORDER — INDAPAMIDE 1.25 MG PO TABS: 1.2500 mg | ORAL_TABLET | Freq: Every day | ORAL | 0 refills | Status: DC

## 2021-07-05 MED ORDER — CHOLECALCIFEROL 50 MCG (2000 UT) PO TABS
2.0000 | ORAL_TABLET | Freq: Every day | ORAL | 1 refills | Status: DC
Start: 2021-07-05 — End: 2022-01-06

## 2021-07-05 MED ORDER — INSULIN PEN NEEDLE 32G X 6 MM MISC: 1.0000 | Freq: Every day | 1 refills | Status: DC

## 2021-07-05 MED ORDER — SAXENDA 18 MG/3ML ~~LOC~~ SOPN: 3.0000 mg | PEN_INJECTOR | Freq: Every day | SUBCUTANEOUS | 1 refills | Status: DC

## 2021-07-05 NOTE — Patient Instructions (Signed)
Health Maintenance, Male Adopting a healthy lifestyle and getting preventive care are important in promoting health and wellness. Ask your health care provider about: The right schedule for you to have regular tests and exams. Things you can do on your own to prevent diseases and keep yourself healthy. What should I know about diet, weight, and exercise? Eat a healthy diet  Eat a diet that includes plenty of vegetables, fruits, low-fat dairy products, and lean protein. Do not eat a lot of foods that are high in solid fats, added sugars, or sodium. Maintain a healthy weight Body mass index (BMI) is a measurement that can be used to identify possible weight problems. It estimates body fat based on height and weight. Your health care provider can help determine your BMI and help you achieve or maintain a healthy weight. Get regular exercise Get regular exercise. This is one of the most important things you can do for your health. Most adults should: Exercise for at least 150 minutes each week. The exercise should increase your heart rate and make you sweat (moderate-intensity exercise). Do strengthening exercises at least twice a week. This is in addition to the moderate-intensity exercise. Spend less time sitting. Even light physical activity can be beneficial. Watch cholesterol and blood lipids Have your blood tested for lipids and cholesterol at 32 years of age, then have this test every 5 years. You may need to have your cholesterol levels checked more often if: Your lipid or cholesterol levels are high. You are older than 32 years of age. You are at high risk for heart disease. What should I know about cancer screening? Many types of cancers can be detected early and may often be prevented. Depending on your health history and family history, you may need to have cancer screening at various ages. This may include screening for: Colorectal cancer. Prostate cancer. Skin cancer. Lung  cancer. What should I know about heart disease, diabetes, and high blood pressure? Blood pressure and heart disease High blood pressure causes heart disease and increases the risk of stroke. This is more likely to develop in people who have high blood pressure readings or are overweight. Talk with your health care provider about your target blood pressure readings. Have your blood pressure checked: Every 3-5 years if you are 18-39 years of age. Every year if you are 40 years old or older. If you are between the ages of 65 and 75 and are a current or former smoker, ask your health care provider if you should have a one-time screening for abdominal aortic aneurysm (AAA). Diabetes Have regular diabetes screenings. This checks your fasting blood sugar level. Have the screening done: Once every three years after age 45 if you are at a normal weight and have a low risk for diabetes. More often and at a younger age if you are overweight or have a high risk for diabetes. What should I know about preventing infection? Hepatitis B If you have a higher risk for hepatitis B, you should be screened for this virus. Talk with your health care provider to find out if you are at risk for hepatitis B infection. Hepatitis C Blood testing is recommended for: Everyone born from 1945 through 1965. Anyone with known risk factors for hepatitis C. Sexually transmitted infections (STIs) You should be screened each year for STIs, including gonorrhea and chlamydia, if: You are sexually active and are younger than 32 years of age. You are older than 32 years of age and your   health care provider tells you that you are at risk for this type of infection. Your sexual activity has changed since you were last screened, and you are at increased risk for chlamydia or gonorrhea. Ask your health care provider if you are at risk. Ask your health care provider about whether you are at high risk for HIV. Your health care provider  may recommend a prescription medicine to help prevent HIV infection. If you choose to take medicine to prevent HIV, you should first get tested for HIV. You should then be tested every 3 months for as long as you are taking the medicine. Follow these instructions at home: Alcohol use Do not drink alcohol if your health care provider tells you not to drink. If you drink alcohol: Limit how much you have to 0-2 drinks a day. Know how much alcohol is in your drink. In the [place] one drink equals one 12 oz bottle of beer (355 mL), one 5 oz glass of wine (148 mL), or one 1 oz glass of hard liquor (44 mL). Lifestyle Do not use any products that contain nicotine or tobacco. These products include cigarettes, chewing tobacco, and vaping devices, such as e-cigarettes. If you need help quitting, ask your health care provider. Do not use street drugs. Do not share needles. Ask your health care provider for help if you need support or information about quitting drugs. General instructions Schedule regular health, dental, and eye exams. Stay current with your vaccines. Tell your health care provider if: You often feel depressed. You have ever been abused or do not feel safe at home. Summary Adopting a healthy lifestyle and getting preventive care are important in promoting health and wellness. Follow your health care provider's instructions about healthy diet, exercising, and getting tested or screened for diseases. Follow your health care provider's instructions on monitoring your cholesterol and blood pressure. This information is not intended to replace advice given to you by your health care provider. Make sure you discuss any questions you have with your health care provider. Document Revised: 07/17/2020 Document Reviewed: 07/17/2020 Elsevier Patient Education  2023 [employer] Inc[employer]  
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
## 2021-07-09 NOTE — Telephone Encounter (Signed)
Approved ?Effective from 07/05/2021 through 11/07/2021. ?

## 2021-07-12 LAB — HIV ANTIBODY (ROUTINE TESTING W REFLEX): HIV 1&2 Ab, 4th Generation: NONREACTIVE

## 2021-07-12 LAB — VITAMIN B1: Vitamin B1 (Thiamine): 9 nmol/L (ref 8–30)

## 2021-07-24 ENCOUNTER — Ambulatory Visit (INDEPENDENT_AMBULATORY_CARE_PROVIDER_SITE_OTHER): Payer: BC Managed Care – PPO | Admitting: Diagnostic Neuroimaging

## 2021-07-24 ENCOUNTER — Encounter: Payer: Self-pay | Admitting: Diagnostic Neuroimaging

## 2021-07-24 VITALS — BP 142/92 | HR 85 | Ht 70.0 in | Wt 313.2 lb

## 2021-07-24 DIAGNOSIS — R202 Paresthesia of skin: Secondary | ICD-10-CM
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
## 2021-09-20 ENCOUNTER — Ambulatory Visit (HOSPITAL_COMMUNITY)
Admission: EM | Admit: 2021-09-20 | Discharge: 2021-09-20 | Disposition: A | Discharge status: Home / Self Care | Payer: BC Managed Care – PPO | Attending: Internal Medicine | Admitting: Internal Medicine

## 2021-09-20 ENCOUNTER — Encounter (HOSPITAL_COMMUNITY): Payer: Self-pay

## 2021-09-20 DIAGNOSIS — G9331 Postviral fatigue syndrome: Secondary | ICD-10-CM | POA: Diagnosis not present

## 2021-09-20 DIAGNOSIS — J069 Acute upper respiratory infection, unspecified: Secondary | ICD-10-CM | POA: Diagnosis not present

## 2021-09-20 DIAGNOSIS — J029 Acute pharyngitis, unspecified: Secondary | ICD-10-CM | POA: Diagnosis not present

## 2021-09-20 LAB — POCT RAPID STREP A, ED / UC: Streptococcus, Group A Screen (Direct): NEGATIVE

## 2021-09-20 MED ORDER — DM-GUAIFENESIN ER 30-600 MG PO TB12: 1.0000 | ORAL_TABLET | Freq: Two times a day (BID) | ORAL | 0 refills | Status: DC

## 2021-09-20 NOTE — ED Provider Notes (Signed)
[facility]    CSN: [id number] Arrival date & time: 09/20/21  1147      History   Chief Complaint Chief Complaint  Patient presents with   Sore Throat   Fatigue   Nasal Congestion    HPI Jon Krause is a 31 y.o. male.   31 year old male presents with sore throat, cough and congestion, nausea.  Patient relates for the past 4 days he has been having persistent upper respiratory symptoms of nasal congestion, sinus congestion, with postnasal drip and sore throat.  Patient indicates that he has pain when swallowing that has been persistent, and his production has been clear.  Patient relates that he has not had any fever, chills and has not been around family or friends that been sick.  Patient also relates that he has had some mild intermittent abdominal discomfort with cramping and nausea, and several loose stools.  Patient relates that this is better today, he has not had any vomiting.  Patient is tolerating fluids well, but he has not been taking any medicine for his congestion.   Sore Throat    Past Medical History:  Diagnosis Date   Anxiety    Depression    Insomnia    Obesity     Patient Active Problem List   Diagnosis Date Noted   Paresthesias 07/05/2021   Vitamin D deficiency 03/19/2019   Class 3 severe obesity due to excess calories with serious comorbidity and body mass index (BMI) of 40.0 to 44.9 in adult [facility]) 03/18/2019   Essential hypertension 03/18/2019   Routine general medical examination at a health care facility 03/18/2019   Lesion of tongue 03/18/2019   Gastroesophageal reflux disease without esophagitis 03/18/2019   High risk sexual behavior 02/14/2016   Depression     History reviewed. No pertinent surgical history.     Home Medications    Prior to Admission medications   Medication Sig Start Date End Date Taking? Authorizing Provider  Cholecalciferol 50 MCG (2000 UT) TABS Take 2 tablets (4,000 Units total) by mouth daily.  07/05/21  Yes Etta Grandchild, MD  dextromethorphan-guaiFENesin [facility] DM) 30-600 MG 12hr tablet Take 1 tablet by mouth 2 (two) times daily. 09/20/21  Yes Ellsworth Lennox, PA-C  indapamide (LOZOL) 1.25 MG tablet Take 1 tablet (1.25 mg total) by mouth daily. 07/05/21  Yes Etta Grandchild, MD  cetirizine (ZYRTEC) 10 MG tablet Take 1 tablet (10 mg total) by mouth daily. 05/26/21   Maretta Bees, PA    Family History Family History  Problem Relation Age of Onset   Seizures Father        grand mal   Prostate cancer Father    Thyroid disease Mother    Cancer Paternal Aunt        colon   Cancer Maternal Aunt    Alcohol abuse Neg Hx    Depression Neg Hx    Diabetes Neg Hx    Drug abuse Neg Hx    Heart disease Neg Hx    Hyperlipidemia Neg Hx    Hypertension Neg Hx    Kidney disease Neg Hx    Stroke Neg Hx     Social History Social History   Tobacco Use   Smoking status: Former    Packs/day: 0.25    Years: 0.50    Total pack years: 0.13    Types: Cigarettes    Quit date: 08/24/2013    Years since quitting: 8.0   Smokeless tobacco: Never  Substance Use Topics   Alcohol use: Yes    Alcohol/week: 3.0 standard drinks of alcohol    Types: 3 Cans of beer per week   Drug use: No     Allergies   Patient has no known allergies.   Review of Systems Review of Systems  HENT:  Positive for postnasal drip, rhinorrhea and sinus pressure.   Respiratory:  Positive for cough.   Gastrointestinal:  Positive for diarrhea.     Physical Exam Triage Vital Signs ED Triage Vitals  Enc Vitals Group     BP 09/20/21 1354 133/87     Pulse Rate 09/20/21 1354 96     Resp 09/20/21 1354 16     Temp 09/20/21 1354 98.2 F (36.8 C)     Temp Source 09/20/21 1354 Oral     SpO2 09/20/21 1354 96 %     Weight 09/20/21 1359 (!) 312 lb (141.5 kg)     Height 09/20/21 1359 5\' 10"  (1.778 m)     Head Circumference --      Peak Flow --      Pain Score 09/20/21 1359 4     Pain Loc --      Pain Edu? --       Excl. in GC? --    No data found.  Updated Vital Signs BP 133/87 (BP Location: Left Arm)   Pulse 96   Temp 98.2 F (36.8 C) (Oral)   Resp 16   Ht 5\' 10"  (1.778 m)   Wt (!) 312 lb (141.5 kg)   SpO2 96%   BMI 44.77 kg/m   Visual Acuity Right Eye Distance:   Left Eye Distance:   Bilateral Distance:    Right Eye Near:   Left Eye Near:    Bilateral Near:     Physical Exam Constitutional:      Appearance: He is well-developed.  HENT:     Right Ear: Ear canal normal. Tympanic membrane is injected.     Left Ear: Ear canal normal. Tympanic membrane is injected.     Mouth/Throat:     Mouth: Mucous membranes are moist.     Pharynx: Oropharynx is clear. No pharyngeal swelling or posterior oropharyngeal erythema.  Cardiovascular:     Rate and Rhythm: Normal rate and regular rhythm.     Heart sounds: Normal heart sounds.  Pulmonary:     Effort: Pulmonary effort is normal.     Breath sounds: Normal breath sounds and air entry. No wheezing, rhonchi or rales.  Abdominal:     General: Abdomen is flat. Bowel sounds are normal.     Palpations: Abdomen is soft.     Tenderness: There is abdominal tenderness. There is no guarding or rebound.  Lymphadenopathy:     Cervical: No cervical adenopathy.  Neurological:     Mental Status: He is alert.      UC Treatments / Results  Labs (all labs ordered are listed, but only abnormal results are displayed) Labs Reviewed  CULTURE, GROUP A STREP [facility])  POCT RAPID STREP A, ED / UC    EKG   Radiology No results found.  Procedures Procedures (including critical care time)  Medications Ordered in UC Medications - No data to display  Initial Impression / Assessment and Plan / UC Course  I have reviewed the triage vital signs and the nursing notes.  Pertinent labs & imaging results that were available during my care of the patient were reviewed by me and considered in  my medical decision making (see chart for details).     Plan: 1.  Advised to take the Mucinex DM 1 every 12 hours to help control the congestion and cough. 2.  Advised to continue using salt water gargles and lozenges to help soothe the sore throat. 3.  Throat culture is pending. 4.  Advised to follow-up PCP or return to urgent care if symptoms fail to improve Final Clinical Impressions(s) / UC Diagnoses   Final diagnoses:  Viral upper respiratory tract infection  Sore throat  Postviral fatigue syndrome     Discharge Instructions      Advised to take the Mucinex DM 1 every 12 hours to help control the congestion and cough. Advised to continue salt water gargles and lozenges to help control the sore throat. Advised to follow-up PCP or return to urgent care if symptoms fail to improve.    ED Prescriptions     Medication Sig Dispense Auth. Provider   dextromethorphan-guaiFENesin (MUCINEX DM) 30-600 MG 12hr tablet Take 1 tablet by mouth 2 (two) times daily. 20 tablet Ellsworth Lennox, PA-C      PDMP not reviewed this encounter.   Ellsworth Lennox, PA-C 09/20/21 1430

## 2021-09-20 NOTE — ED Triage Notes (Signed)
Fatigue and abdominal discomfort onset Monday night. Patient has been out of work due to not feeling well. Patient feeling weak, sore throat, and nasal congestion.   No known sick exposure, no one at home with similar symptoms.

## 2021-09-20 NOTE — Discharge Instructions (Addendum)
Advised to take the Mucinex DM 1 every 12 hours to help control the congestion and cough. Advised to continue salt water gargles and lozenges to help control the sore throat. Advised to follow-up PCP or return to urgent care if symptoms fail to improve.

## 2021-09-22 LAB — CULTURE, GROUP A STREP (THRC)

## 2021-10-04 ENCOUNTER — Ambulatory Visit: Payer: BC Managed Care – PPO | Admitting: Internal Medicine

## 2021-10-04 ENCOUNTER — Encounter: Payer: Self-pay | Admitting: Internal Medicine

## 2021-10-04 VITALS — BP 142/88 | HR 87 | Temp 98.2°F | Resp 16 | Ht 70.0 in | Wt 315.5 lb

## 2021-10-04 DIAGNOSIS — I10 Essential (primary) hypertension: Secondary | ICD-10-CM

## 2021-10-04 DIAGNOSIS — E876 Hypokalemia: Secondary | ICD-10-CM

## 2021-10-04 DIAGNOSIS — Z6841 Body Mass Index (BMI) 40.0 and over, adult: Secondary | ICD-10-CM

## 2021-10-04 DIAGNOSIS — T502X5A Adverse effect of carbonic-anhydrase inhibitors, benzothiadiazides and other diuretics, initial encounter: Secondary | ICD-10-CM

## 2021-10-04 LAB — BASIC METABOLIC PANEL
BUN: 12 mg/dL (ref 6–23)
CO2: 30 mEq/L (ref 19–32)
Calcium: 9.9 mg/dL (ref 8.4–10.5)
Chloride: 101 mEq/L (ref 96–112)
Creatinine, Ser: 0.86 mg/dL (ref 0.40–1.50)
GFR: 115.31 mL/min (ref >60.00)
Glucose, Bld: 99 mg/dL (ref 70–99)
Potassium: 3.5 mEq/L (ref 3.5–5.1)
Sodium: 139 mEq/L (ref 135–145)

## 2021-10-04 MED ORDER — INDAPAMIDE 1.25 MG PO TABS: 1.2500 mg | ORAL_TABLET | Freq: Every day | ORAL | 1 refills | Status: AC

## 2021-10-04 MED ORDER — NEBIVOLOL HCL 5 MG PO TABS: 5.0000 mg | ORAL_TABLET | Freq: Every day | ORAL | 1 refills | Status: AC

## 2021-10-04 MED ORDER — POTASSIUM CHLORIDE CRYS ER 15 MEQ PO TBCR: 15.0000 meq | EXTENDED_RELEASE_TABLET | Freq: Two times a day (BID) | ORAL | 1 refills | Status: AC

## 2021-10-04 NOTE — Progress Notes (Signed)
Subjective:  Patient ID: Jon Krause, male    DOB: [birth year]  Age: 31 y.o. MRN: [id number]  CC: Hypertension   HPI Jon Krause presents for f/up -  He has not been working on his lifestyle modifications.  He denies chest pain, shortness of breath, diaphoresis, edema, dizziness, or lightheadedness.  Outpatient Medications Prior to Visit  Medication Sig Dispense Refill   Cholecalciferol 50 MCG (2000 UT) TABS Take 2 tablets (4,000 Units total) by mouth daily. 180 tablet 1   indapamide (LOZOL) 1.25 MG tablet Take 1 tablet (1.25 mg total) by mouth daily. 90 tablet 0   cetirizine (ZYRTEC) 10 MG tablet Take 1 tablet (10 mg total) by mouth daily. (Patient not taking: Reported on 10/04/2021) 30 tablet 0   dextromethorphan-guaiFENesin (MUCINEX DM) 30-600 MG 12hr tablet Take 1 tablet by mouth 2 (two) times daily. (Patient not taking: Reported on 10/04/2021) 20 tablet 0   No facility-administered medications prior to visit.    ROS Review of Systems  Constitutional:  Positive for unexpected weight change (wt gain). Negative for diaphoresis and fatigue.  HENT: Negative.    Eyes: Negative.   Respiratory:  Negative for cough, shortness of breath and wheezing.   Cardiovascular:  Negative for chest pain, palpitations and leg swelling.  Gastrointestinal:  Negative for abdominal pain and diarrhea.  Endocrine: Negative.   Genitourinary: Negative.  Negative for difficulty urinating.  Musculoskeletal: Negative.   Skin: Negative.   Neurological: Negative.  Negative for dizziness, weakness and light-headedness.  Hematological:  Negative for adenopathy. Does not bruise/bleed easily.  Psychiatric/Behavioral: Negative.      Objective:  BP (!) 142/88 (BP Location: Right Arm, Patient Position: Sitting, Cuff Size: Large)   Pulse 87   Temp 98.2 F (36.8 C) (Oral)   Resp 16   Ht 5\' 10"  (1.778 m)   Wt (!) 315 lb 8 oz (143.1 kg)   SpO2 96%   BMI 45.27 kg/m   BP Readings from Last 3 Encounters:   10/04/21 (!) 142/88  09/20/21 133/87  07/24/21 (!) 142/92    Wt Readings from Last 3 Encounters:  10/04/21 (!) 315 lb 8 oz (143.1 kg)  09/20/21 (!) 312 lb (141.5 kg)  07/24/21 (!) 313 lb 4 oz (142.1 kg)    Physical Exam Vitals reviewed.  Constitutional:      Appearance: He is obese. He is not ill-appearing.  HENT:     Mouth/Throat:     Mouth: Mucous membranes are moist.  Eyes:     General: No scleral icterus.    Conjunctiva/sclera: Conjunctivae normal.  Cardiovascular:     Rate and Rhythm: Normal rate and regular rhythm.     Heart sounds: No murmur heard. Pulmonary:     Effort: Pulmonary effort is normal.     Breath sounds: No stridor. No wheezing, rhonchi or rales.  Abdominal:     General: Abdomen is protuberant. Bowel sounds are normal. There is no distension.     Palpations: Abdomen is soft. There is no hepatomegaly, splenomegaly or mass.     Tenderness: There is no abdominal tenderness. There is no guarding.  Musculoskeletal:        General: Normal range of motion.     Cervical back: Neck supple.     Right lower leg: No edema.     Left lower leg: No edema.  Lymphadenopathy:     Cervical: No cervical adenopathy.  Skin:    General: Skin is warm and dry.  Neurological:  General: No focal deficit present.  Psychiatric:        Mood and Affect: Mood normal.     Lab Results  Component Value Date   WBC 8.3 07/05/2021   HGB 14.0 07/05/2021   HCT 42.1 07/05/2021   PLT 339.0 07/05/2021   GLUCOSE 99 10/04/2021   CHOL 228 (H) 07/05/2021   TRIG 104.0 07/05/2021   HDL 45.20 07/05/2021   LDLCALC 162 (H) 07/05/2021   ALT 53 07/05/2021   AST 28 07/05/2021   NA 139 10/04/2021   K 3.5 10/04/2021   CL 101 10/04/2021   CREATININE 0.86 10/04/2021   BUN 12 10/04/2021   CO2 30 10/04/2021   TSH 2.84 07/05/2021   HGBA1C 5.6 07/05/2021    No results found.  Assessment & Plan:   Jon Krause was seen today for hypertension.  Diagnoses and all orders for this  visit:  Class 3 severe obesity due to excess calories with serious comorbidity and body mass index (BMI) of 40.0 to 44.9 in adult [facility]) -     Amb Referral to Nutrition and Diabetic Education  Essential hypertension- His blood pressure is not adequately well controlled.  His potassium is down to 3.5.  Will stay on the current dose of indapamide, will start a potassium supplement, and  add nebivolol. -     Amb Referral to Nutrition and Diabetic Education -     Basic metabolic panel; Future -     Basic metabolic panel -     indapamide (LOZOL) 1.25 MG tablet; Take 1 tablet (1.25 mg total) by mouth daily. -     nebivolol (BYSTOLIC) 5 MG tablet; Take 1 tablet (5 mg total) by mouth daily. -     potassium chloride SA (KLOR-CON M15) 15 MEQ tablet; Take 1 tablet (15 mEq total) by mouth 2 (two) times daily.  Diuretic-induced hypokalemia -     potassium chloride SA (KLOR-CON M15) 15 MEQ tablet; Take 1 tablet (15 mEq total) by mouth 2 (two) times daily.   I have discontinued [name]'s dextromethorphan-guaiFENesin. I am also having him start on nebivolol and potassium chloride SA. Additionally, I am having him maintain his cetirizine, Cholecalciferol, and indapamide.  Meds ordered this encounter  Medications   indapamide (LOZOL) 1.25 MG tablet    Sig: Take 1 tablet (1.25 mg total) by mouth daily.    Dispense:  90 tablet    Refill:  1   nebivolol (BYSTOLIC) 5 MG tablet    Sig: Take 1 tablet (5 mg total) by mouth daily.    Dispense:  90 tablet    Refill:  1   potassium chloride SA (KLOR-CON M15) 15 MEQ tablet    Sig: Take 1 tablet (15 mEq total) by mouth 2 (two) times daily.    Dispense:  180 tablet    Refill:  1     Follow-up: Return in about 6 months (around 04/06/2022).  Sanda Linger, MD

## 2021-10-04 NOTE — Patient Instructions (Signed)
Hypertension, Adult High blood pressure (hypertension) is when the force of blood pumping through the arteries is too strong. The arteries are the blood vessels that carry blood from the heart throughout the body. Hypertension forces the heart to work harder to pump blood and may cause arteries to become narrow or stiff. Untreated or uncontrolled hypertension can lead to a heart attack, heart failure, a stroke, kidney disease, and other problems. A blood pressure reading consists of a higher number over a lower number. Ideally, your blood pressure should be below 120/80. The first ("top") number is called the systolic pressure. It is a measure of the pressure in your arteries as your heart beats. The second ("bottom") number is called the diastolic pressure. It is a measure of the pressure in your arteries as the heart relaxes. What are the causes? The exact cause of this condition is not known. There are some conditions that result in high blood pressure. What increases the risk? Certain factors may make you more likely to develop high blood pressure. Some of these risk factors are under your control, including: Smoking. Not getting enough exercise or physical activity. Being overweight. Having too much fat, sugar, calories, or salt (sodium) in your diet. Drinking too much alcohol. Other risk factors include: Having a personal history of heart disease, diabetes, high cholesterol, or kidney disease. Stress. Having a family history of high blood pressure and high cholesterol. Having obstructive sleep apnea. Age. The risk increases with age. What are the signs or symptoms? High blood pressure may not cause symptoms. Very high blood pressure (hypertensive crisis) may cause: Headache. Fast or irregular heartbeats (palpitations). Shortness of breath. Nosebleed. Nausea and vomiting. Vision changes. Severe chest pain, dizziness, and seizures. How is this diagnosed? This condition is diagnosed by  measuring your blood pressure while you are seated, with your arm resting on a flat surface, your legs uncrossed, and your feet flat on the floor. The cuff of the blood pressure monitor will be placed directly against the skin of your upper arm at the level of your heart. Blood pressure should be measured at least twice using the same arm. Certain conditions can cause a difference in blood pressure between your right and left arms. If you have a high blood pressure reading during one visit or you have normal blood pressure with other risk factors, you may be asked to: Return on a different day to have your blood pressure checked again. Monitor your blood pressure at home for 1 week or longer. If you are diagnosed with hypertension, you may have other blood or imaging tests to help your health care provider understand your overall risk for other conditions. How is this treated? This condition is treated by making healthy lifestyle changes, such as eating healthy foods, exercising more, and reducing your alcohol intake. You may be referred for counseling on a healthy diet and physical activity. Your health care provider may prescribe medicine if lifestyle changes are not enough to get your blood pressure under control and if: Your systolic blood pressure is above 130. Your diastolic blood pressure is above 80. Your personal target blood pressure may vary depending on your medical conditions, your age, and other factors. Follow these instructions at home: Eating and drinking  Eat a diet that is high in fiber and potassium, and low in sodium, added sugar, and fat. An example of this eating plan is called the DASH diet. DASH stands for Dietary Approaches to Stop Hypertension. To eat this way: Eat   plenty of fresh fruits and vegetables. Try to fill one half of your plate at each meal with fruits and vegetables. Eat whole grains, such as whole-wheat pasta, brown rice, or whole-grain bread. Fill about one  fourth of your plate with whole grains. Eat or drink low-fat dairy products, such as skim milk or low-fat yogurt. Avoid fatty cuts of meat, processed or cured meats, and poultry with skin. Fill about one fourth of your plate with lean proteins, such as fish, chicken without skin, beans, eggs, or tofu. Avoid pre-made and processed foods. These tend to be higher in sodium, added sugar, and fat. Reduce your daily sodium intake. Many people with hypertension should eat less than 1,500 mg of sodium a day. Do not drink alcohol if: Your health care provider tells you not to drink. You are pregnant, may be pregnant, or are planning to become pregnant. If you drink alcohol: Limit how much you have to: 0-1 drink a day for women. 0-2 drinks a day for men. Know how much alcohol is in your drink. In the [place] one drink equals one 12 oz bottle of beer (355 mL), one 5 oz glass of wine (148 mL), or one 1 oz glass of hard liquor (44 mL). Lifestyle  Work with your health care provider to maintain a healthy body weight or to lose weight. Ask what an ideal weight is for you. Get at least 30 minutes of exercise that causes your heart to beat faster (aerobic exercise) most days of the week. Activities may include walking, swimming, or biking. Include exercise to strengthen your muscles (resistance exercise), such as Pilates or lifting weights, as part of your weekly exercise routine. Try to do these types of exercises for 30 minutes at least 3 days a week. Do not use any products that contain nicotine or tobacco. These products include cigarettes, chewing tobacco, and vaping devices, such as e-cigarettes. If you need help quitting, ask your health care provider. Monitor your blood pressure at home as told by your health care provider. Keep all follow-up visits. This is important. Medicines Take over-the-counter and prescription medicines only as told by your health care provider. Follow directions carefully. Blood  pressure medicines must be taken as prescribed. Do not skip doses of blood pressure medicine. Doing this puts you at risk for problems and can make the medicine less effective. Ask your health care provider about side effects or reactions to medicines that you should watch for. Contact a health care provider if you: Think you are having a reaction to a medicine you are taking. Have headaches that keep coming back (recurring). Feel dizzy. Have swelling in your ankles. Have trouble with your vision. Get help right away if you: Develop a severe headache or confusion. Have unusual weakness or numbness. Feel faint. Have severe pain in your chest or abdomen. Vomit repeatedly. Have trouble breathing. These symptoms may be an emergency. Get help right away. Call 911. Do not wait to see if the symptoms will go away. Do not drive yourself to the hospital. Summary Hypertension is when the force of blood pumping through your arteries is too strong. If this condition is not controlled, it may put you at risk for serious complications. Your personal target blood pressure may vary depending on your medical conditions, your age, and other factors. For most people, a normal blood pressure is less than 120/80. Hypertension is treated with lifestyle changes, medicines, or a combination of both. Lifestyle changes include losing weight, eating a healthy,   low-sodium diet, exercising more, and limiting alcohol. This information is not intended to replace advice given to you by your health care provider. Make sure you discuss any questions you have with your health care provider. Document Revised: 01/02/2021 Document Reviewed: 01/02/2021 [employer] Patient Education  2023 [employer]  

## 2022-01-06 ENCOUNTER — Other Ambulatory Visit: Payer: Self-pay | Admitting: Internal Medicine

## 2022-01-06 DIAGNOSIS — E559 Vitamin D deficiency, unspecified: Secondary | ICD-10-CM
